# Patient Record
Sex: Male | Born: 1965 | Race: White | Hispanic: No | Marital: Married | Smoking: Former smoker
Health system: Southern US, Community
[De-identification: ages and names within clinical notes are randomized; demographics above are authoritative.]

## PROBLEM LIST (undated history)

## (undated) DIAGNOSIS — E785 Hyperlipidemia, unspecified: Secondary | ICD-10-CM

## (undated) DIAGNOSIS — L719 Rosacea, unspecified: Secondary | ICD-10-CM

## (undated) HISTORY — PX: TONSILLECTOMY: SUR1361

## (undated) HISTORY — PX: TYMPANOSTOMY TUBE PLACEMENT: SHX32

## (undated) HISTORY — PX: VASECTOMY: SHX75

## (undated) HISTORY — DX: Hyperlipidemia, unspecified: E78.5

## (undated) HISTORY — DX: Rosacea, unspecified: L71.9

---

## 1998-09-28 ENCOUNTER — Ambulatory Visit (HOSPITAL_COMMUNITY): Admission: RE | Admit: 1998-09-28 | Discharge: 1998-09-28 | Payer: Self-pay | Admitting: Family Medicine

## 2018-01-09 DIAGNOSIS — L719 Rosacea, unspecified: Secondary | ICD-10-CM | POA: Diagnosis not present

## 2018-01-09 DIAGNOSIS — Z0000 Encounter for general adult medical examination without abnormal findings: Secondary | ICD-10-CM | POA: Diagnosis not present

## 2018-01-09 DIAGNOSIS — E782 Mixed hyperlipidemia: Secondary | ICD-10-CM | POA: Diagnosis not present

## 2018-02-02 DIAGNOSIS — L918 Other hypertrophic disorders of the skin: Secondary | ICD-10-CM | POA: Diagnosis not present

## 2018-04-13 DIAGNOSIS — H2513 Age-related nuclear cataract, bilateral: Secondary | ICD-10-CM | POA: Diagnosis not present

## 2018-04-13 DIAGNOSIS — H40013 Open angle with borderline findings, low risk, bilateral: Secondary | ICD-10-CM | POA: Diagnosis not present

## 2018-07-16 DIAGNOSIS — M79604 Pain in right leg: Secondary | ICD-10-CM | POA: Diagnosis not present

## 2018-07-25 ENCOUNTER — Other Ambulatory Visit: Payer: Self-pay

## 2018-07-25 DIAGNOSIS — I83891 Varicose veins of right lower extremities with other complications: Secondary | ICD-10-CM

## 2018-08-09 ENCOUNTER — Encounter: Payer: Self-pay | Admitting: Surgery

## 2018-08-09 ENCOUNTER — Ambulatory Visit: Payer: BLUE CROSS/BLUE SHIELD | Admitting: Surgery

## 2018-08-09 ENCOUNTER — Other Ambulatory Visit: Payer: Self-pay

## 2018-08-09 ENCOUNTER — Ambulatory Visit (HOSPITAL_COMMUNITY)
Admission: RE | Admit: 2018-08-09 | Discharge: 2018-08-09 | Disposition: A | Discharge status: Home / Self Care | Payer: BLUE CROSS/BLUE SHIELD | Source: Ambulatory Visit | Attending: Surgery | Admitting: Surgery

## 2018-08-09 VITALS — BP 115/74 | HR 55 | Temp 98.2°F | Resp 20 | Ht 70.0 in | Wt 205.0 lb

## 2018-08-09 DIAGNOSIS — M25561 Pain in right knee: Secondary | ICD-10-CM | POA: Diagnosis not present

## 2018-08-09 DIAGNOSIS — I83891 Varicose veins of right lower extremities with other complications: Secondary | ICD-10-CM | POA: Insufficient documentation

## 2018-08-09 NOTE — Progress Notes (Signed)
[facility] [place]  Patient name: Francisco VASSELL MRN: [id number] DOB: [birth year] Sex: male   REQUESTING PROVIDER:    Aliene Beams   REASON FOR CONSULT:    Right leg pain  HISTORY OF PRESENT ILLNESS:   Francisco Vincent is a 52 y.o. male, who is referred today for evaluation of right leg pain.  The patient states that he noticed this beginning back in November.  It is painful to the touch.  It is a very sharp pain.  It he leaves it alone it does not bother him.  This is not associated with any swelling.  He denies any history of trauma.  Patient is a former smoker.  He suffers hypercholesterolemia which is managed with a statin.  PAST MEDICAL HISTORY    Past Medical History:  Diagnosis Date  . Hyperlipidemia   . Rosacea      FAMILY HISTORY   History reviewed. No pertinent family history.  SOCIAL HISTORY:   Social History   Socioeconomic History  . Marital status: Single    Spouse name: Not on file  . Number of children: Not on file  . Years of education: Not on file  . Highest education level: Not on file  Occupational History  . Not on file  Social Needs  . Financial resource strain: Not on file  . Food insecurity:    Worry: Not on file    Inability: Not on file  . Transportation needs:    Medical: Not on file    Non-medical: Not on file  Tobacco Use  . Smoking status: Former Games developer  . Smokeless tobacco: Never Used  Substance and Sexual Activity  . Alcohol use: Yes  . Drug use: Never  . Sexual activity: Not on file  Lifestyle  . Physical activity:    Days per week: Not on file    Minutes per session: Not on file  . Stress: Not on file  Relationships  . Social connections:    Talks on phone: Not on file    Gets together: Not on file    Attends religious service: Not on file    Active member of club or organization: Not on file    Attends meetings of clubs or organizations: Not on file   Relationship status: Not on file  . Intimate partner violence:    Fear of current or ex partner: Not on file    Emotionally abused: Not on file    Physically abused: Not on file    Forced sexual activity: Not on file  Other Topics Concern  . Not on file  Social History Narrative  . Not on file    ALLERGIES:    Allergies  Allergen Reactions  . Penicillins Anaphylaxis    CURRENT MEDICATIONS:    Current Outpatient Medications  Medication Sig Dispense Refill  . Calcium Polycarbophil (FIBER-CAPS PO) Take by mouth.    . fluticasone (FLONASE) 50 MCG/ACT nasal spray Place 2 sprays into both nostrils daily.    . Loratadine (CLARITIN) 10 MG CAPS Take by mouth.    . Multiple Vitamin (MULTIVITAMIN) tablet Take 1 tablet by mouth daily.    . Omega-3 Fatty Acids (FISH OIL BURP-LESS PO) Take by mouth.    . pravastatin (PRAVACHOL) 40 MG tablet Take 40 mg by mouth daily.     No current facility-administered medications for this visit.     REVIEW OF SYSTEMS:   [X]  denotes positive finding, [ ]  denotes negative  finding Cardiac  Comments:  Chest pain or chest pressure:    Shortness of breath upon exertion:    Short of breath when lying flat:    Irregular heart rhythm:        Vascular    Pain in calf, thigh, or hip brought on by ambulation:    Pain in feet at night that wakes you up from your sleep:     Blood clot in your veins:    Leg swelling:         Pulmonary    Oxygen at home:    Productive cough:     Wheezing:         Neurologic    Sudden weakness in arms or legs:     Sudden numbness in arms or legs:     Sudden onset of difficulty speaking or slurred speech:    Temporary loss of vision in one eye:     Problems with dizziness:         Gastrointestinal    Blood in stool:      Vomited blood:         Genitourinary    Burning when urinating:     Blood in urine:        Psychiatric    Major depression:         Hematologic    Bleeding problems:    Problems with  blood clotting too easily:        Skin    Rashes or ulcers:        Constitutional    Fever or chills:     PHYSICAL EXAM:   Vitals:   08/09/18 1523  BP: 115/74  Pulse: (!) 55  Resp: 20  Temp: 98.2 F (36.8 C)  SpO2: 97%  Weight: 205 lb (93 kg)  Height: 5\' 10"  (1.778 m)    GENERAL: The patient is a well-nourished male, in no acute distress. The vital signs are documented above. CARDIAC: There is a regular rate and rhythm.  VASCULAR: Multiphasic Doppler signals in bilateral dorsalis pedis, posterior tibial artery.  No significant edema bilateral lower extremity PULMONARY: Nonlabored respirations ABDOMEN: Soft and non-tender with normal pitched bowel sounds.  MUSCULOSKELETAL: There are no major deformities or cyanosis. NEUROLOGIC: No focal weakness or paresthesias are detected. SKIN: There are no ulcers or rashes noted. PSYCHIATRIC: The patient has a normal affect.  STUDIES:   I have ordered and reviewed the vascular lab studies with the following findings:  Venous Reflux Times Normal value < 0.5 sec +------------------------------+----------+---------+                               Right (ms)Left (ms) +------------------------------+----------+---------+ GSV at Saphenofemoral junction3154.00             +------------------------------+----------+---------+ GSV prox thigh                5325.00             +------------------------------+----------+---------+ GSV mid thigh                 3638.00             +------------------------------+----------+---------+ GSV dist thigh                3051.00             +------------------------------+----------+---------+ GSV at knee  1929.00             +------------------------------+----------+---------+ GSV prox calf                 1518.00             +------------------------------+----------+---------+  Vein  Diameters: +------------------------------+----------+---------+                               Right (cm)Left (cm) +------------------------------+----------+---------+ GSV at Saphenofemoral junction0.758               +------------------------------+----------+---------+ GSV at prox thigh             0.601               +------------------------------+----------+---------+ GSV at mid thigh              0.385               +------------------------------+----------+---------+ GSV at distal thigh           0.275               +------------------------------+----------+---------+ GSV at knee                   0.321               +------------------------------+----------+---------+ GSV prox calf                 0.364               +------------------------------+----------+---------+ GSV mid calf                  0.337               +------------------------------+----------+---------+ SSV origin                    0.186               +------------------------------+----------+---------+ SSV prox                      0.183               +------------------------------+----------+---------+ SSV mid                       0.132               +------------------------------+----------+---------+    Right Reflux Technical Findings: Distal thigh GSV travels out of fascia.   Summary: Right: Abnormal reflux times were noted in the great saphenous vein at the saphenofemoral junction, great saphenous vein at the proximal thigh, great saphenous vein at the mid thigh, great saphenous vein at the distal thigh, great saphenous vein at the  knee, and great saphenous vein at the prox calf. There is no evidence of deep vein thrombosis in the lower extremity in the CFV, FV, and POPV. There is no evidence of superficial venous thrombosis in the GSV and SSV. ASSESSMENT and PLAN   Right leg pain: I discussed with the patient that I do not feel that his symptoms  are related to a vascular etiology.  He has a normal arterial exam.  The venous reflux test was slightly abnormal however I do not feel that this would explain his current symptoms.  This to me sounds like a peripheral nerve issue.  He states that it has improved over the past several weeks.  I suspect that this will resolve with time.   Durene CalWells Brabham, MD [facility] Tel [phone] Pager [phone]

## 2018-10-22 DIAGNOSIS — J069 Acute upper respiratory infection, unspecified: Secondary | ICD-10-CM | POA: Diagnosis not present

## 2019-01-11 DIAGNOSIS — Z0000 Encounter for general adult medical examination without abnormal findings: Secondary | ICD-10-CM | POA: Diagnosis not present

## 2019-01-11 DIAGNOSIS — E782 Mixed hyperlipidemia: Secondary | ICD-10-CM | POA: Diagnosis not present

## 2019-01-11 DIAGNOSIS — L719 Rosacea, unspecified: Secondary | ICD-10-CM | POA: Diagnosis not present

## 2019-01-11 DIAGNOSIS — Z8249 Family history of ischemic heart disease and other diseases of the circulatory system: Secondary | ICD-10-CM | POA: Diagnosis not present

## 2019-02-01 DIAGNOSIS — Z125 Encounter for screening for malignant neoplasm of prostate: Secondary | ICD-10-CM | POA: Diagnosis not present

## 2019-02-01 DIAGNOSIS — E782 Mixed hyperlipidemia: Secondary | ICD-10-CM | POA: Diagnosis not present

## 2019-02-07 ENCOUNTER — Telehealth: Payer: Self-pay | Admitting: *Deleted

## 2019-02-07 NOTE — Telephone Encounter (Signed)
Spoke to patient - virtual appt change to in office appt. Verbalized understanding       Primary Cardiologist: No primary care provider on file.   Pt contacted.  History and symptoms reviewed.  Pt will f/u with [facility] provider as scheduled.  Pt. advised that we are restricting visitors at this time and request that only patients present for check-in prior to their appointment.  All other visitors should remain in their car.  If necessary, only one visitor may come with the patient, into the building. For everyone's safety, all patients and visitor entering our practice area should expect to be screened again prior to entering our waiting area. Aware to wear face covering .  Raiford Simmonds, RN  02/07/2019 11:43 AM

## 2019-02-08 ENCOUNTER — Telehealth: Payer: Self-pay | Admitting: Cardiology

## 2019-02-08 NOTE — Telephone Encounter (Signed)

## 2019-02-11 ENCOUNTER — Encounter: Payer: Self-pay | Admitting: Cardiology

## 2019-02-11 ENCOUNTER — Other Ambulatory Visit: Payer: Self-pay

## 2019-02-11 ENCOUNTER — Ambulatory Visit: Payer: BC Managed Care – PPO | Admitting: Cardiology

## 2019-02-11 VITALS — BP 133/82 | HR 62 | Ht 70.0 in | Wt 203.2 lb

## 2019-02-11 DIAGNOSIS — R0789 Other chest pain: Secondary | ICD-10-CM

## 2019-02-11 DIAGNOSIS — Z8249 Family history of ischemic heart disease and other diseases of the circulatory system: Secondary | ICD-10-CM

## 2019-02-11 NOTE — Progress Notes (Signed)
PCP: Elias Elseeade, Robert, MD  Clinic Note: Chief Complaint  Patient presents with  . New Patient (Initial Visit)  . Chest Pain    No family history of CAD    HPI: Francisco Vincent is a 53 y.o. male who is being seen today for the evaluation of chest pain with family history of CAD.  He has been seen as a self referral after discussion with his PCP, Elias Elseeade, Robert, MD.  Francisco Vincent was last seen recently by 1 of the PAs working with his PCP and he noted having some episodes of discomfort between his shoulder blades associated with exertion and with rest.  As such he was referred for cardiology evaluation based on the fact he has a pretty significant family history of CAD in his father and grandparents. (FH updated)   Recent Hospitalizations: none  Studies Personally Reviewed - (if available, images/films reviewed: From Epic Chart or Care Everywhere)  n/a  Interval History: [name] states that he is usually doing fairly well.  He has been trying to increase his level of exercise doing a lot of walking over the summer during the COVID-19 restrictions as part of a work-related incentive package.  He notes that about a month ago they had really picked up their walking in the intensity of walking more than usual and when he was doing that he started noticing a lot of pain between his shoulder blades not really any dyspnea or any shoulder or chest pain.  This concerned him however because his father when he first had bypass surgery of 54 really did not have symptoms at all.  He was found to multivessel disease and underwent CABG.  Now he also indicates that over the last couple months since the COVID-19 restrictions he is been working in home and does not still have a very comfortable dynamic chair and thinks some of it may be related to that as well.  He is just a little bit concerned about his family history. Otherwise completely asymptomatic from a cardiac standpoint:  No chest pain at rest for  shortness of breath with rest or exertion.  No PND, orthopnea or edema. No palpitations, lightheadedness, dizziness, weakness or syncope/near syncope. No TIA/amaurosis fugax symptoms. No melena, hematochezia, hematuria, or epstaxis. No claudication.  ROS: A comprehensive was performed. Review of Systems  Constitutional: Negative for malaise/fatigue.  Gastrointestinal: Negative for abdominal pain, constipation and vomiting.  Musculoskeletal: Positive for back pain (As noted in HPI). Negative for falls and joint pain.  Neurological: Negative for dizziness, focal weakness and headaches.  All other systems reviewed and are negative.   The patient does not have symptoms concerning for COVID-19 infection (fever, chills, cough, or new shortness of breath).  The patient is practicing social distancing.  Working @ home since March.   COVID-19 Education: The signs and symptoms of COVID-19 were discussed with the patient and how to seek care for testing (follow up with PCP or arrange E-visit).   The importance of social distancing was discussed today.   I have reviewed and (if needed) personally updated the patient's problem list, medications, allergies, past medical and surgical history, social and family history.   Past Medical History:  Diagnosis Date  . Hyperlipidemia    Has been on pravastatin for over 20 years after his father underwent CABG  . Rosacea     Past Surgical History:  Procedure Laterality Date  . TONSILLECTOMY    . TYMPANOSTOMY TUBE PLACEMENT    . VASECTOMY  Current Meds  Medication Sig  . Calcium Polycarbophil (FIBER-CAPS PO) Take by mouth.  . fluticasone (FLONASE) 50 MCG/ACT nasal spray Place 2 sprays into both nostrils daily.  . Loratadine (CLARITIN) 10 MG CAPS Take by mouth.  . Multiple Vitamin (MULTIVITAMIN) tablet Take 1 tablet by mouth daily.  . Omega-3 Fatty Acids (FISH OIL BURP-LESS PO) Take by mouth.  . pravastatin (PRAVACHOL) 40 MG tablet Take 40  mg by mouth daily.    Allergies  Allergen Reactions  . Penicillins Anaphylaxis    Social History   Tobacco Use  . Smoking status: Former Games developermoker  . Smokeless tobacco: Never Used  Substance Use Topics  . Alcohol use: Yes  . Drug use: Never   Social History   Social History Narrative   Is a very active gentleman tries to get routine exercise.  Smoked in college, but not since.    family history includes CAD in his maternal grandfather, maternal grandmother, and paternal grandfather; CAD (age of onset: 1755) in his father; Dementia in his mother; Narcolepsy in his mother.  Wt Readings from Last 3 Encounters:  02/11/19 203 lb 3.2 oz (92.2 kg)  08/09/18 205 lb (93 kg)    PHYSICAL EXAM BP 133/82   Pulse 62   Ht 5\' 10"  (1.778 m)   Wt 203 lb 3.2 oz (92.2 kg)   BMI 29.16 kg/m  Physical Exam  Constitutional: He is oriented to person, place, and time. He appears well-developed and well-nourished. No distress.  Well-groomed.  Healthy-appearing  HENT:  Head: Normocephalic and atraumatic.  Mouth/Throat: Oropharynx is clear and moist.  Eyes: Pupils are equal, round, and reactive to light. Conjunctivae and EOM are normal. No scleral icterus.  Neck: Normal range of motion. Neck supple. No hepatojugular reflux and no JVD present. Carotid bruit is not present.  Cardiovascular: Normal rate, regular rhythm, normal heart sounds, intact distal pulses and normal pulses.  No extrasystoles are present. PMI is not displaced. Exam reveals no gallop and no friction rub.  No murmur heard. Pulmonary/Chest: Effort normal and breath sounds normal. No respiratory distress. He has no wheezes. He has no rales.  Abdominal: Soft. Bowel sounds are normal. He exhibits no distension. There is no abdominal tenderness. There is no rebound.  Musculoskeletal: Normal range of motion.        General: No edema.  Neurological: He is alert and oriented to person, place, and time. No cranial nerve deficit.  Skin: Skin  is warm and dry.  Psychiatric: He has a normal mood and affect. His behavior is normal. Judgment and thought content normal.  Vitals reviewed.    Adult ECG Report  Rate: 62 ;  Rhythm: normal sinus rhythm and Normal axis, intervals and durations.;   Narrative Interpretation: Normal EKG   Other studies Reviewed: Additional studies/ records that were reviewed today include:  Recent Labs:   No labs available    ASSESSMENT / PLAN:  [name] definitely has a pretty significant family history of CAD in his grandparents as well as his father.  He is relatively anxious father was when he was diagnosed with CAD.  Although symptoms do sound relatively atypical for an anginal equivalent, it is little concerning.  We talked about different options to evaluate.  I think since he was having symptoms that he has been having before, I would like to put him a treadmill stress test in order to see if he can reproduce symptoms.  If there is any abnormality then we would probably proceed  with a more invasive imaging study.  If this study is relatively normal, then we talked about the possibility of a coronary calcium score for baseline cardiovascular risk in the absence of symptoms or abnormal stress test.  He will continue atorvastatin and we talked about the possibility of starting baby aspirin pending results.  Problem List Items Addressed This Visit    Family history of premature CAD   Relevant Orders   EKG 12-Lead (Completed)   EXERCISE TOLERANCE TEST (ETT)   Atypical chest pain - Primary   Relevant Orders   EKG 12-Lead (Completed)   EXERCISE TOLERANCE TEST (ETT)      I spent a total of 25 minutes with the patient and chart review. >  50% of the time was spent in direct patient consultation.  Additional 10 minutes spent with charting.  Current medicines are reviewed at length with the patient today.  (+/- concerns) none The following changes have been made:  None none  Patient Instructions   Medication Instructions:   NO CHANGES   Lab work:  NIT NEEDED  Testing/Procedures:  WILL BE SCHEDULE AT [place] [address] PRIOR TO TEST A COVID TEST WILL NEED TO BE COMPLETED AT LEAST 3 DAYS PRIOR-  [place] TES IS COMPLETED-- Your physician has requested that you have an exercise tolerance test. For further information please visit [email or web address]. Please also follow instruction sheet, as given.    Follow-Up: At [facility], you and your health needs are our priority.  As part of our continuing mission to provide you with exceptional heart care, we have created designated Provider Care Teams.  These Care Teams include your primary Cardiologist (physician) and Advanced Practice Providers (APPs -  Physician Assistants and Nurse Practitioners) who all work together to provide you with the care you need, when you need it. . You will need a follow up appointment in 1  months.  Please call our office 2 months in advance to schedule this appointment.  You may see Glenetta Hew, MD or one of the following Advanced Practice Providers on your designated Care Team:   . Rosaria Ferries, PA-C . Jory Sims, DNP, ANP  Any Other Special Instructions Will Be Listed Below (If Applicable).    Studies Ordered:   Orders Placed This Encounter  Procedures  . EXERCISE TOLERANCE TEST (ETT)  . EKG 12-Lead      Glenetta Hew, M.D., M.S. Interventional Cardiologist   Pager # [phone] Phone # [phone] [address] [place], [place] [address]   Thank you for choosing Heartcare at [facility]!!

## 2019-02-11 NOTE — Patient Instructions (Addendum)
Medication Instructions:   NO CHANGES   Lab work:  NIT NEEDED  Testing/Procedures:  WILL BE SCHEDULE AT [place] [address] PRIOR TO TEST A COVID TEST WILL NEED TO BE COMPLETED AT LEAST 3 DAYS PRIOR-  [place] TES IS COMPLETED-- Your physician has requested that you have an exercise tolerance test. For further information please visit [email or web address]. Please also follow instruction sheet, as given.    Follow-Up: At [facility], you and your health needs are our priority.  As part of our continuing mission to provide you with exceptional heart care, we have created designated Provider Care Teams.  These Care Teams include your primary Cardiologist (physician) and Advanced Practice Providers (APPs -  Physician Assistants and Nurse Practitioners) who all work together to provide you with the care you need, when you need it. . You will need a follow up appointment in 1  months.  Please call our office 2 months in advance to schedule this appointment.  You may see Glenetta Hew, MD or one of the following Advanced Practice Providers on your designated Care Team:   . Rosaria Ferries, PA-C . Jory Sims, DNP, ANP  Any Other Special Instructions Will Be Listed Below (If Applicable).

## 2019-02-12 ENCOUNTER — Encounter: Payer: Self-pay | Admitting: Cardiology

## 2019-02-27 ENCOUNTER — Telehealth: Payer: Self-pay | Admitting: Cardiology

## 2019-02-27 NOTE — Telephone Encounter (Signed)
Patient is calling to see when he will need his covid test before his test next week.  Please call with info.

## 2019-02-27 NOTE — Telephone Encounter (Signed)
  There is a GXT scheduled for Francisco Vincent, a patient of Dr. Ellyn Hack, on Tuesday 03/05/19. Please call the patient and schedule/order Covid testing by Friday 03/01/19 at [facility] and instruct the patient to self-isolate after the Covid testing is performed until the GXT appointment. If the patient does not have the Covid testing performed and if we don't have the testing results back prior to the GXT appointment, we will have to cancel the test.  [name]      Left detail message on patient secure voicemail   scheduled for covid test July 17,2020 at 2:55 pm  [name] [address] [facility]- [address] Self isolate until test is completed on Tuesday July 21 ,2020 appointment also will be in mychart Any question may call back

## 2019-02-28 ENCOUNTER — Telehealth (HOSPITAL_COMMUNITY): Payer: Self-pay

## 2019-02-28 NOTE — Telephone Encounter (Signed)
Encounter complete. 

## 2019-03-01 ENCOUNTER — Other Ambulatory Visit (HOSPITAL_COMMUNITY)
Admission: RE | Admit: 2019-03-01 | Discharge: 2019-03-01 | Disposition: A | Discharge status: Home / Self Care | Payer: BC Managed Care – PPO | Source: Ambulatory Visit | Attending: Cardiology | Admitting: Cardiology

## 2019-03-01 DIAGNOSIS — Z1159 Encounter for screening for other viral diseases: Secondary | ICD-10-CM | POA: Insufficient documentation

## 2019-03-01 LAB — SARS CORONAVIRUS 2 (TAT 6-24 HRS): SARS Coronavirus 2: NEGATIVE

## 2019-03-05 ENCOUNTER — Ambulatory Visit (HOSPITAL_COMMUNITY)
Admission: RE | Admit: 2019-03-05 | Discharge: 2019-03-05 | Disposition: A | Discharge status: Home / Self Care | Payer: BC Managed Care – PPO | Source: Ambulatory Visit | Attending: Cardiology | Admitting: Cardiology

## 2019-03-05 ENCOUNTER — Other Ambulatory Visit: Payer: Self-pay

## 2019-03-05 DIAGNOSIS — Z8249 Family history of ischemic heart disease and other diseases of the circulatory system: Secondary | ICD-10-CM

## 2019-03-05 DIAGNOSIS — R0789 Other chest pain: Secondary | ICD-10-CM

## 2019-03-05 LAB — EXERCISE TOLERANCE TEST
Estimated workload: 12 METS
Exercise duration (min): 10 min
Exercise duration (sec): 12 s
MPHR: 170 {beats}/min
Peak HR: 162 {beats}/min
Percent HR: 98 %
Rest HR: 68 {beats}/min

## 2019-03-11 ENCOUNTER — Ambulatory Visit: Payer: BC Managed Care – PPO | Admitting: Cardiology

## 2019-03-11 ENCOUNTER — Other Ambulatory Visit: Payer: Self-pay

## 2019-03-11 ENCOUNTER — Encounter: Payer: Self-pay | Admitting: Cardiology

## 2019-03-11 ENCOUNTER — Telehealth: Payer: Self-pay | Admitting: Cardiology

## 2019-03-11 VITALS — BP 118/82 | HR 58 | Temp 97.5°F | Ht 70.0 in | Wt 204.2 lb

## 2019-03-11 DIAGNOSIS — Z8249 Family history of ischemic heart disease and other diseases of the circulatory system: Secondary | ICD-10-CM

## 2019-03-11 DIAGNOSIS — R0789 Other chest pain: Secondary | ICD-10-CM | POA: Diagnosis not present

## 2019-03-11 NOTE — Patient Instructions (Addendum)
Medication Instructions:   If you need a refill on your cardiac medications before your next appointment, please call your pharmacy.   Lab work:  If you have labs (blood work) drawn today and your tests are completely normal, you will receive your results only by: Marland Kitchen. MyChart Message (if you have MyChart) OR . A paper copy in the mail If you have any lab test that is abnormal or we need to change your treatment, we will call you to review the results.  Testing/Procedures: WILL BE SCHEDULE IN JAN 2021 CT coronary calcium score. This test is done at [address]. This is $150 out of pocket.   Coronary CalciumScan A coronary calcium scan is an imaging test used to look for deposits of calcium and other fatty materials (plaques) in the inner lining of the blood vessels of the heart (coronary arteries). These deposits of calcium and plaques can partly clog and narrow the coronary arteries without producing any symptoms or warning signs. This puts a person at risk for a heart attack. This test can detect these deposits before symptoms develop. Tell a health care provider about:  Any allergies you have.  All medicines you are taking, including vitamins, herbs, eye drops, creams, and over-the-counter medicines.  Any problems you or family members have had with anesthetic medicines.  Any blood disorders you have.  Any surgeries you have had.  Any medical conditions you have.  Whether you are pregnant or may be pregnant. What are the risks? Generally, this is a safe procedure. However, problems may occur, including:  Harm to a pregnant woman and her unborn baby. This test involves the use of radiation. Radiation exposure can be dangerous to a pregnant woman and her unborn baby. If you are pregnant, you generally should not have this procedure done.  Slight increase in the risk of cancer. This is because of the radiation involved in the test. What happens before the  procedure? No preparation is needed for this procedure. What happens during the procedure?  You will undress and remove any jewelry around your neck or chest.  You will put on a hospital gown.  Sticky electrodes will be placed on your chest. The electrodes will be connected to an electrocardiogram (ECG) machine to record a tracing of the electrical activity of your heart.  A CT scanner will take pictures of your heart. During this time, you will be asked to lie still and hold your breath for 2-3 seconds while a picture of your heart is being taken. The procedure may vary among health care providers and hospitals. What happens after the procedure?  You can get dressed.  You can return to your normal activities.  It is up to you to get the results of your test. Ask your health care provider, or the department that is doing the test, when your results will be ready. Summary  A coronary calcium scan is an imaging test used to look for deposits of calcium and other fatty materials (plaques) in the inner lining of the blood vessels of the heart (coronary arteries).  Generally, this is a safe procedure. Tell your health care provider if you are pregnant or may be pregnant.  No preparation is needed for this procedure.  A CT scanner will take pictures of your heart.  You can return to your normal activities after the scan is done. This information is not intended to replace advice given to you by your health care provider. Make  sure you discuss any questions you have with your health care provider. Document Released: 01/28/2008 Document Revised: 06/20/2016 Document Reviewed: 06/20/2016 Elsevier Interactive Patient Education  2017 [place]: At [facility], you and your health needs are our priority.  As part of our continuing mission to provide you with exceptional heart care, we have created designated Provider Care Teams.  These Care Teams include your primary  Cardiologist (physician) and Advanced Practice Providers (APPs -  Physician Assistants and Nurse Practitioners) who all work together to provide you with the care you need, when you need it. . You will need a follow up appointment in   7  Months JAN /FEB 2021.  Please call our office 2 months in advance to schedule this appointment.  You may see Glenetta Hew, MD or one of the following Advanced Practice Providers on your designated Care Team:   . Rosaria Ferries, PA-C . Jory Sims, DNP, ANP  Any Other Special Instructions Will Be Listed Below (If Applicable).

## 2019-03-11 NOTE — Telephone Encounter (Signed)

## 2019-03-11 NOTE — Progress Notes (Signed)
PCP: Elias Elseeade, Robert, MD  Clinic Note: Chief Complaint  Patient presents with  . Follow-up    1 month f/u no complaints today. Meds reviewed verbally with pt.    HPI: Francisco Vincent is a 53 y.o. male who is being seen today for the evaluation of chest pain with family history of CAD.  He has been seen as a self referral after discussion with his PCP, Elias Elseeade, Robert, MD.  Francisco Vincent was seen in initial consultation on June 29 at the request of 1 of his PCPs PAs.  This was in response to several episodes of discomfort between his shoulder blades both with exertion and at rest.  Referral for cardiology evaluation based on significant family history of CAD. --> He indicated to me that he actually was doing fairly well but was try to increase his level of activity he was doing over the summer with increasing his level of walking.  Several months ago when picking up the intensity of his echo she started noticing pain between his shoulder blades while exerting himself, there was no associated dyspnea or true chest pain. -->  Since that episode he notes that he purchased a more comfortable chair to do his work from home during the COVID-19 restrictions.  After doing so, this discomfort in his back seem to have improved. --> He was evaluated with a GXT  Recent Hospitalizations: none  Studies Personally Reviewed - (if available, images/films reviewed: From Epic Chart or Care Everywhere)  GXT 03/06/2019: Exercised for 10:12 min.  Stopped because of difficulty breathing (related to mask).  Achieved 85% max.  Heart rate at 9.2 min.  1 mm upsloping ST segment depression noted in inferior leads.  (Nondiagnostic upsloping ST segment changes).  Negative stress test  Interval History: Francisco Vincent returns today actually doing fine.  He has not had any more of the back discomfort and has tried to pick up his level of exercise.  He was pleased to hear the results of his stress test, and felt like he probably could walk  further on the test is mask required to wear because of COVID-19.  Cardiovascular ROS: no chest pain or dyspnea on exertion negative for - edema, irregular heartbeat, orthopnea, palpitations, paroxysmal nocturnal dyspnea, rapid heart rate, shortness of breath or Syncope/near syncope, TIA/amaurosis fugax, claudication  ROS: A comprehensive was performed. Review of Systems  Constitutional: Negative for malaise/fatigue.  HENT: Negative for nosebleeds.   Respiratory: Negative for cough and shortness of breath.   Gastrointestinal: Negative for abdominal pain, constipation and vomiting.  Genitourinary: Negative for hematuria.  Musculoskeletal: Negative for back pain (No further back pain), falls and joint pain.  Neurological: Negative for dizziness, focal weakness and headaches.  Psychiatric/Behavioral: Negative.     The patient does not have symptoms concerning for COVID-19 infection (fever, chills, cough, or new shortness of breath).  The patient is practicing social distancing.  He is still working from home as he has been since March   COVID-19 Education: The signs and symptoms of COVID-19 were discussed with the patient and how to seek care for testing (follow up with PCP or arrange E-visit).   The importance of social distancing was discussed today.  I have reviewed and (if needed) personally updated the patient's problem list, medications, allergies, past medical and surgical history, social and family history.   Past Medical History:  Diagnosis Date  . Hyperlipidemia    Has been on pravastatin for over 20 years after his father underwent  CABG  . Rosacea     Past Surgical History:  Procedure Laterality Date  . TONSILLECTOMY    . TYMPANOSTOMY TUBE PLACEMENT    . VASECTOMY      Current Meds  Medication Sig  . Calcium Polycarbophil (FIBER-CAPS PO) Take by mouth.  . fluticasone (FLONASE) 50 MCG/ACT nasal spray Place 2 sprays into both nostrils as needed.   . Loratadine  (CLARITIN) 10 MG CAPS Take by mouth.  . Multiple Vitamin (MULTIVITAMIN) tablet Take 1 tablet by mouth daily.  . Omega-3 Fatty Acids (FISH OIL BURP-LESS PO) Take by mouth.  . pravastatin (PRAVACHOL) 40 MG tablet Take 40 mg by mouth daily.    Allergies  Allergen Reactions  . Penicillins Anaphylaxis    Social History   Tobacco Use  . Smoking status: Former Research scientist (life sciences)  . Smokeless tobacco: Never Used  Substance Use Topics  . Alcohol use: Yes  . Drug use: Never   Social History   Social History Narrative   Is a very active gentleman tries to get routine exercise.  Smoked in college, but not since.    family history includes CAD in his maternal grandfather, maternal grandmother, and paternal grandfather; CAD (age of onset: 50) in his father; Dementia in his mother; Narcolepsy in his mother.  Wt Readings from Last 3 Encounters:  03/11/19 204 lb 4 oz (92.6 kg)  02/11/19 203 lb 3.2 oz (92.2 kg)  08/09/18 205 lb (93 kg)    PHYSICAL EXAM BP 118/82 (BP Location: Left Arm, Patient Position: Sitting, Cuff Size: Normal)   Pulse (!) 58   Temp (!) 97.5 F (36.4 C)   Ht 5\' 10"  (1.778 m)   Wt 204 lb 4 oz (92.6 kg)   SpO2 98%   BMI 29.31 kg/m  Physical Exam  Constitutional: He is oriented to person, place, and time. He appears well-developed and well-nourished. No distress.  Well-groomed.  Healthy-appearing  HENT:  Head: Normocephalic and atraumatic.  Mouth/Throat: Oropharynx is clear and moist.  Eyes: No scleral icterus.  Neck: Normal range of motion. Neck supple. No hepatojugular reflux and no JVD present. Carotid bruit is not present.  Cardiovascular: Normal rate, regular rhythm, normal heart sounds, intact distal pulses and normal pulses.  No extrasystoles are present. PMI is not displaced. Exam reveals no gallop and no friction rub.  No murmur heard. Pulmonary/Chest: Effort normal and breath sounds normal. No respiratory distress. He has no wheezes. He has no rales.  Abdominal:  Soft. Bowel sounds are normal. He exhibits no distension. There is no abdominal tenderness.  Musculoskeletal: Normal range of motion.        General: No edema.  Neurological: He is alert and oriented to person, place, and time.  Psychiatric: He has a normal mood and affect. His behavior is normal. Judgment and thought content normal.  Vitals reviewed.    Adult ECG Report  Rate: 58;  Rhythm: sinus bradycardia and Otherwise normal axis, intervals and durations.;   Narrative Interpretation: Normal EKG   Other studies Reviewed: Additional studies/ records that were reviewed today include:  Recent Labs:   No labs available   ASSESSMENT / PLAN:  Francisco Vincent is here to discuss results of his GXT.  Did very well.  He is still concerned however about his family history of CAD and would like to investigate his cardiovascular risk in more detail.  The GXT answer the questions as to his concerning exertional back pain, but does not answer the question about the knee  underlying risk of CAD.  He is on a statin, and has well-controlled blood pressure.  With a negative GXT, further ischemic evaluation is warranted, however I think a coronary calcium score would be a reasonable option help determine baseline cardiovascular risk and the potential need for more aggressive lipid management.  He remains on pravastatin, but unless there is significant calcification seen on coronary calcium score, I would not necessarily recommend aspirin at this time.  Problem List Items Addressed This Visit    Family history of premature CAD (Chronic)   Relevant Orders   EKG 12-Lead (Completed)   CT CARDIAC SCORING   Atypical chest pain - Primary   Relevant Orders   EKG 12-Lead (Completed)   CT CARDIAC SCORING      I spent a total of 15minutes with the patient and chart review. >  50% of the time was spent in direct patient consultation.  Current medicines are reviewed at length with the patient today.  (+/-  concerns) none The following changes have been made:  None   Patient Instructions  Medication Instructions:  None  If you need a refill on your cardiac medications before your next appointment, please call your pharmacy.   Lab work: n/a  Testing/Procedures: WILL BE SCHEDULE IN JAN 2021 CT coronary calcium score. This test is done at [address]. This is $150 out of pocket.  Follow-Up: . You will need a follow up appointment in   7  Months JAN /FEB 2021.  Please call our office 2 months in advance to schedule this appointment.  You may see Bryan Lemmaavid Jahking Lesser, MD or one of the following Advanced Practice Providers on your designated Care Team:   . Theodore DemarkRhonda Barrett, PA-C . Joni ReiningKathryn Lawrence, DNP, ANP  Any Other Special Instructions Will Be Listed Below (If Applicable).    Studies Ordered:   Orders Placed This Encounter  Procedures  . CT CARDIAC SCORING  . EKG 12-Lead      Bryan Lemmaavid Elyjah Hazan, M.D., M.S. Interventional Cardiologist   Pager # [phone] Phone # [phone] [address] [place], [place] [address]   Thank you for choosing [facility] at [facility]!!

## 2019-03-12 ENCOUNTER — Telehealth: Payer: Self-pay | Admitting: Cardiology

## 2019-03-12 NOTE — Telephone Encounter (Signed)
Left message regarding Calcium Scoring scheduled for September 11, 2019 at 8:30 am.  Location [address]  Patient instruced to call if any questions.  Will also mail information to patient.

## 2019-03-14 ENCOUNTER — Encounter: Payer: Self-pay | Admitting: Cardiology

## 2019-04-12 DIAGNOSIS — H40013 Open angle with borderline findings, low risk, bilateral: Secondary | ICD-10-CM | POA: Diagnosis not present

## 2019-04-12 DIAGNOSIS — H2513 Age-related nuclear cataract, bilateral: Secondary | ICD-10-CM | POA: Diagnosis not present

## 2019-06-05 DIAGNOSIS — Z23 Encounter for immunization: Secondary | ICD-10-CM | POA: Diagnosis not present

## 2019-07-08 DIAGNOSIS — Z03818 Encounter for observation for suspected exposure to other biological agents ruled out: Secondary | ICD-10-CM | POA: Diagnosis not present

## 2019-09-11 ENCOUNTER — Inpatient Hospital Stay: Admission: RE | Admit: 2019-09-11 | Payer: BC Managed Care – PPO | Source: Ambulatory Visit

## 2019-09-16 ENCOUNTER — Ambulatory Visit (INDEPENDENT_AMBULATORY_CARE_PROVIDER_SITE_OTHER)
Admission: RE | Admit: 2019-09-16 | Discharge: 2019-09-16 | Disposition: A | Discharge status: Home / Self Care | Payer: Self-pay | Source: Ambulatory Visit | Attending: Cardiology | Admitting: Cardiology

## 2019-09-16 ENCOUNTER — Other Ambulatory Visit: Payer: Self-pay

## 2019-09-16 DIAGNOSIS — R0789 Other chest pain: Secondary | ICD-10-CM

## 2019-09-16 DIAGNOSIS — Z8249 Family history of ischemic heart disease and other diseases of the circulatory system: Secondary | ICD-10-CM

## 2019-09-17 ENCOUNTER — Telehealth: Payer: Self-pay

## 2019-09-17 NOTE — Telephone Encounter (Signed)
-----   Message from Marykay Lex, MD sent at 09/16/2019 11:24 PM EST ----- Coronary artery calcium score shows a calcium score of 34 which is relatively low risk.  There is also calcium noted on the aorta.  This suggest that there is atherosclerosis on the heart arteries and aorta, but not likely to be significant.  Recommendation is to continue risk factor modification but no further testing needed.  Bryan Lemma, MD

## 2019-10-14 ENCOUNTER — Ambulatory Visit: Payer: 59 | Admitting: Cardiology

## 2019-10-14 ENCOUNTER — Other Ambulatory Visit: Payer: Self-pay

## 2019-10-14 ENCOUNTER — Encounter: Payer: Self-pay | Admitting: Cardiology

## 2019-10-14 VITALS — BP 118/80 | HR 63 | Ht 70.0 in | Wt 208.0 lb

## 2019-10-14 DIAGNOSIS — Z8249 Family history of ischemic heart disease and other diseases of the circulatory system: Secondary | ICD-10-CM

## 2019-10-14 DIAGNOSIS — R0789 Other chest pain: Secondary | ICD-10-CM | POA: Diagnosis not present

## 2019-10-14 NOTE — Progress Notes (Signed)
Primary Care Provider: Maury Dus, MD Cardiologist: No primary care provider on file. Electrophysiologist: None  Clinic Note: Chief Complaint  Patient presents with  . Follow-up    Test results     HPI:    Francisco Vincent is a 54 y.o. male who is being seen today for 6+ month follow-up to discuss results of coronary patient with significant family history.  He was initially seen at the request of Maury Dus, MD.  Francisco Vincent was last seen on March 11, 2019 for 1 month follow-up after GXT that was done in order to indicate for that he was safe doing a significant bout of walking with his family over the course of the summer etc.  We then talked about checking a coronary calcium score for baseline risk assessment given his family history of CAD.  Other than the study being ordered, no change in medications were made.  Recent Hospitalizations: None  Reviewed  CV studies:    The following studies were reviewed today: (if available, images/films reviewed: From Epic Chart or Care Everywhere) . Coronary Calcium Score: 34   Interval History:   Francisco Vincent returns today doing well.  He is trying to do this he can exercise, but is having a hard time losing weight.  He seems to be tolerating pravastatin.  He notes his blood pressure is complete well-controlled.  He is not having any active angina or heart failure symptoms.  No syncope or near syncope.  No TIA amaurosis fugax or palpitations. He is exercising relatively regularly and doing other walks.  Was very happy to hear the results of his coronary calcium score.  The patient does not have symptoms concerning for COVID-19 infection (fever, chills, cough, or new shortness of breath).  The patient is practicing social distancing & Masking.    REVIEWED OF SYSTEMS   Review of Systems  Constitutional: Negative for malaise/fatigue.  HENT: Negative for congestion.   Gastrointestinal: Negative for blood in stool and melena.    Genitourinary: Negative for hematuria.  Musculoskeletal: Negative for falls and joint pain.  Neurological: Negative for dizziness and headaches.  Psychiatric/Behavioral: Negative.     I have reviewed and (if needed) personally updated the patient's problem list, medications, allergies, past medical and surgical history, social and family history.   PAST MEDICAL HISTORY   Past Medical History:  Diagnosis Date  . Hyperlipidemia    Has been on pravastatin for over 20 years after his father underwent CABG  . Rosacea     PAST SURGICAL HISTORY   Past Surgical History:  Procedure Laterality Date  . TONSILLECTOMY    . TYMPANOSTOMY TUBE PLACEMENT    . VASECTOMY      GXT 03/06/2019: Exercised for 10:12 min.  Stopped because of difficulty breathing (related to mask).  Achieved 85% max.  Heart rate at 9.2 min.  1 mm upsloping ST segment depression noted in inferior leads.  (Nondiagnostic upsloping ST segment changes).  Negative stress test  MEDICATIONS/ALLERGIES   Current Meds  Medication Sig  . Calcium Polycarbophil (FIBER-CAPS PO) Take by mouth.  . fluticasone (FLONASE) 50 MCG/ACT nasal spray Place 2 sprays into both nostrils as needed.   . Loratadine (CLARITIN) 10 MG CAPS Take by mouth.  . Multiple Vitamin (MULTIVITAMIN) tablet Take 1 tablet by mouth daily.  . Omega-3 Fatty Acids (FISH OIL BURP-LESS PO) Take by mouth.  . pravastatin (PRAVACHOL) 40 MG tablet Take 40 mg by mouth daily.    Allergies  Allergen Reactions  . Penicillins Anaphylaxis    SOCIAL HISTORY/FAMILY HISTORY   Reviewed in Epic:  Pertinent findings: None  OBJCTIVE -PE, EKG, labs   Wt Readings from Last 3 Encounters:  10/14/19 208 lb (94.3 kg)  03/11/19 204 lb 4 oz (92.6 kg)  02/11/19 203 lb 3.2 oz (92.2 kg)    Physical Exam: BP 118/80   Pulse 63   Ht 5\' 10"  (1.778 m)   Wt 208 lb (94.3 kg)   SpO2 96%   BMI 29.84 kg/m  Physical Exam  Constitutional: He is oriented to person, place, and time. He  appears well-developed and well-nourished. No distress.  Healthy-appearing.  Well-groomed  HENT:  Head: Normocephalic and atraumatic.  Neck: No hepatojugular reflux present. Carotid bruit is not present.  Cardiovascular: Normal rate, regular rhythm, normal heart sounds and intact distal pulses.  No extrasystoles are present. PMI is not displaced. Exam reveals no friction rub.  No murmur heard. Pulmonary/Chest: No respiratory distress.  Neurological: He is alert and oriented to person, place, and time.  Psychiatric: He has a normal mood and affect. His behavior is normal. Judgment and thought content normal.  Vitals reviewed.    Adult ECG Report n/a  Recent Labs: From June 2020-TC 164, TG 78, HDL 40, LDL 78.  CR 1.02.  K+ 4.1. No results found for: CHOL, HDL, LDLCALC, LDLDIRECT, TRIG, CHOLHDL No results found for: CREATININE, BUN, NA, K, CL, CO2 No results found for: TSH  ASSESSMENT/PLAN    Problem List Items Addressed This Visit    Family history of premature CAD - Primary (Chronic)   Atypical chest pain     Francisco Vincent has a pretty significant family history of CAD, but with very low risk coronary calcium score and his level of activity, he seems to be relatively low risk for having significant CAD.  My recommendation was for him to continue his diet and exercise trying to lose weight and get his cholesterol down.  Once he is at target with LDL between 70-100 or lower, he should be at decreased risk.   He can continue statin, but I do not think he needs a beta-blocker or ACE inhibitor given his current blood pressures.  I suspect that he was having some discomfort in the past that is no longer there and he is doing well with his exercise.  No medication changes.  Continue statin.  Follow-up as needed.  COVID-19 Education: The signs and symptoms of COVID-19 were discussed with the patient and how to seek care for testing (follow up with PCP or arrange E-visit).   The importance  of social distancing was discussed today.  I spent a total of Gaspar Garbe with the patient. >  50% of the time was spent in direct patient consultation.  Additional time spent with chart review  / charting (studies, outside notes, etc): 4 Total Time: 16 min   Current medicines are reviewed at length with the patient today.  (+/- concerns) n/a   Patient Instructions / Medication Changes & Studies & Tests Ordered   Patient Instructions  Medication Instructions:  No changes *If you need a refill on your cardiac medications before your next appointment, please call your pharmacy*   Lab Work: Not needed   Testing/Procedures: Not needed   Follow-Up: At [facility], you and your health needs are our priority.  As part of our continuing mission to provide you with exceptional heart care, we have created designated Provider Care Teams.  These Care Teams include  your primary Cardiologist (physician) and Advanced Practice Providers (APPs -  Physician Assistants and Nurse Practitioners) who all work together to provide you with the care you need, when you need it.  We recommend signing up for the patient portal called "MyChart".  Sign up information is provided on this After Visit Summary.  MyChart is used to connect with patients for Virtual Visits (Telemedicine).  Patients are able to view lab/test results, encounter notes, upcoming appointments, etc.  Non-urgent messages can be sent to your provider as well.   To learn more about what you can do with MyChart, go to [email or web address].    Your next appointment:   As needed   The format for your next appointment:   Either In Person or Virtual  Provider:   Bryan Lemma, MD   Other Instructions n/a    Studies Ordered:   No orders of the defined types were placed in this encounter.    Bryan Lemma, M.D., M.S. Interventional Cardiologist   Pager # [phone] Phone # [phone] [address] [place], [place] [address]   Thank you for choosing Heartcare at [facility]!!

## 2019-10-14 NOTE — Patient Instructions (Signed)
Medication Instructions:  No changes *If you need a refill on your cardiac medications before your next appointment, please call your pharmacy*   Lab Work: Not needed   Testing/Procedures: Not needed   Follow-Up: At [facility], you and your health needs are our priority.  As part of our continuing mission to provide you with exceptional heart care, we have created designated Provider Care Teams.  These Care Teams include your primary Cardiologist (physician) and Advanced Practice Providers (APPs -  Physician Assistants and Nurse Practitioners) who all work together to provide you with the care you need, when you need it.  We recommend signing up for the patient portal called "MyChart".  Sign up information is provided on this After Visit Summary.  MyChart is used to connect with patients for Virtual Visits (Telemedicine).  Patients are able to view lab/test results, encounter notes, upcoming appointments, etc.  Non-urgent messages can be sent to your provider as well.   To learn more about what you can do with MyChart, go to [email or web address].    Your next appointment:   As needed   The format for your next appointment:   Either In Person or Virtual  Provider:   Bryan Lemma, MD   Other Instructions n/a

## 2019-10-16 ENCOUNTER — Encounter: Payer: Self-pay | Admitting: Cardiology

## 2021-04-05 ENCOUNTER — Emergency Department (HOSPITAL_COMMUNITY): Payer: No Typology Code available for payment source

## 2021-04-05 ENCOUNTER — Encounter (HOSPITAL_COMMUNITY): Payer: Self-pay

## 2021-04-05 ENCOUNTER — Emergency Department (HOSPITAL_COMMUNITY)
Admission: EM | Admit: 2021-04-05 | Discharge: 2021-04-05 | Disposition: A | Discharge status: Home / Self Care | Payer: No Typology Code available for payment source | Attending: Emergency Medicine | Admitting: Emergency Medicine

## 2021-04-05 ENCOUNTER — Other Ambulatory Visit: Payer: Self-pay

## 2021-04-05 DIAGNOSIS — Z87891 Personal history of nicotine dependence: Secondary | ICD-10-CM | POA: Diagnosis not present

## 2021-04-05 DIAGNOSIS — R1013 Epigastric pain: Secondary | ICD-10-CM | POA: Diagnosis not present

## 2021-04-05 DIAGNOSIS — R1011 Right upper quadrant pain: Secondary | ICD-10-CM | POA: Diagnosis not present

## 2021-04-05 DIAGNOSIS — R1010 Upper abdominal pain, unspecified: Secondary | ICD-10-CM | POA: Diagnosis present

## 2021-04-05 LAB — CBC WITH DIFFERENTIAL/PLATELET
Abs Immature Granulocytes: 0.03 10*3/uL (ref 0.00–0.07)
Basophils Absolute: 0.1 10*3/uL (ref 0.0–0.1)
Basophils Relative: 1 %
Eosinophils Absolute: 0.6 10*3/uL — ABNORMAL HIGH (ref 0.0–0.5)
Eosinophils Relative: 6 %
HCT: 48.5 % (ref 39.0–52.0)
Hemoglobin: 16.8 g/dL (ref 13.0–17.0)
Immature Granulocytes: 0 %
Lymphocytes Relative: 38 %
Lymphs Abs: 3.5 10*3/uL (ref 0.7–4.0)
MCH: 30.2 pg (ref 26.0–34.0)
MCHC: 34.6 g/dL (ref 30.0–36.0)
MCV: 87.1 fL (ref 80.0–100.0)
Monocytes Absolute: 0.7 10*3/uL (ref 0.1–1.0)
Monocytes Relative: 7 %
Neutro Abs: 4.4 10*3/uL (ref 1.7–7.7)
Neutrophils Relative %: 48 %
Platelets: 242 10*3/uL (ref 150–400)
RBC: 5.57 MIL/uL (ref 4.22–5.81)
RDW: 13.6 % (ref 11.5–15.5)
WBC: 9.2 10*3/uL (ref 4.0–10.5)
nRBC: 0 % (ref 0.0–0.2)

## 2021-04-05 LAB — COMPREHENSIVE METABOLIC PANEL
ALT: 37 U/L (ref 0–44)
AST: 23 U/L (ref 15–41)
Albumin: 4.5 g/dL (ref 3.5–5.0)
Alkaline Phosphatase: 64 U/L (ref 38–126)
Anion gap: 11 (ref 5–15)
BUN: 14 mg/dL (ref 6–20)
CO2: 21 mmol/L — ABNORMAL LOW (ref 22–32)
Calcium: 9.6 mg/dL (ref 8.9–10.3)
Chloride: 105 mmol/L (ref 98–111)
Creatinine, Ser: 0.88 mg/dL (ref 0.61–1.24)
GFR, Estimated: >60 mL/min (ref >60)
Glucose, Bld: 112 mg/dL — ABNORMAL HIGH (ref 70–99)
Potassium: 3.7 mmol/L (ref 3.5–5.1)
Sodium: 137 mmol/L (ref 135–145)
Total Bilirubin: 1 mg/dL (ref 0.3–1.2)
Total Protein: 7.2 g/dL (ref 6.5–8.1)

## 2021-04-05 LAB — URINALYSIS, ROUTINE W REFLEX MICROSCOPIC
Bilirubin Urine: NEGATIVE
Glucose, UA: NEGATIVE mg/dL
Hgb urine dipstick: NEGATIVE
Ketones, ur: NEGATIVE mg/dL
Leukocytes,Ua: NEGATIVE
Nitrite: NEGATIVE
Protein, ur: NEGATIVE mg/dL
Specific Gravity, Urine: 1.014 (ref 1.005–1.030)
pH: 5 (ref 5.0–8.0)

## 2021-04-05 LAB — LIPASE, BLOOD: Lipase: 49 U/L (ref 11–51)

## 2021-04-05 MED ORDER — LIDOCAINE VISCOUS HCL 2 % MT SOLN
15.0000 mL | Freq: Once | OROMUCOSAL | Status: AC
  Administered 2021-04-05: 15 mL via ORAL
  Filled 2021-04-05: qty 15

## 2021-04-05 MED ORDER — ALUM & MAG HYDROXIDE-SIMETH 200-200-20 MG/5ML PO SUSP
30.0000 mL | Freq: Once | ORAL | Status: AC
  Administered 2021-04-05: 30 mL via ORAL
  Filled 2021-04-05: qty 30

## 2021-04-05 MED ORDER — IOHEXOL 350 MG/ML SOLN
80.0000 mL | Freq: Once | INTRAVENOUS | Status: AC | PRN
  Administered 2021-04-05: 80 mL via INTRAVENOUS

## 2021-04-05 NOTE — ED Triage Notes (Signed)
Pt complains of abdominal pain that feels like a muscle ache x a few hours.

## 2021-04-05 NOTE — Discharge Instructions (Addendum)
The source of your symptoms was not identified today.   You had a CT scan performed today that showed a small nodule on your liver. Please follow-up with your family doctor for further evaluation of this.   You may take Pepcid or Zantac, available over-the-counter as needed for abdominal pain.   Get rechecked if you develop fevers, uncontrolled pain or new concerning symptoms.

## 2021-04-05 NOTE — ED Provider Notes (Signed)
[facility] [place] [facility]-EMERGENCY DEPT Provider Note   CSN: [id number] Arrival date & time: 04/05/21  0049     History Chief Complaint  Patient presents with   Abdominal Pain    Francisco Vincent is a 55 y.o. male.  The history is provided by the patient and medical records.  Abdominal Pain Francisco Vincent is a 55 y.o. male who presents to the Emergency Department complaining of abdominal pain. He presents the emergency department for evaluation of upper abdominal pain that started around midnight and will confirm sleep. Pain is described as a muscle pain that does not change with positioning or activity. No associated fever, chest pain, shortness of breath, nausea, vomiting, diarrhea, constipation, dysuria. No prior similar symptoms. He has a history of hyperlipidemia. No tobacco. Occasional alcohol use. No street drug use. No prior abdominal surgeries. No prior similar symptoms.    Past Medical History:  Diagnosis Date   Hyperlipidemia    Has been on pravastatin for over 20 years after his father underwent CABG   Rosacea     Patient Active Problem List   Diagnosis Date Noted   Atypical chest pain 02/11/2019   Family history of premature CAD 02/11/2019    Past Surgical History:  Procedure Laterality Date   TONSILLECTOMY     TYMPANOSTOMY TUBE PLACEMENT     VASECTOMY         Family History  Problem Relation Age of Onset   Dementia Mother    Narcolepsy Mother    CAD Father 60       CABG @ 78 (had checkup b/c Mother's CAD) -- now s/p PCI as well   CAD Maternal Grandmother    CAD Maternal Grandfather    CAD Paternal Grandfather     Social History   Tobacco Use   Smoking status: Former   Smokeless tobacco: Never  Building services engineer Use: Never used  Substance Use Topics   Alcohol use: Yes   Drug use: Never    Home Medications Prior to Admission medications   Medication Sig Start Date End Date Taking? Authorizing Provider  Calcium Polycarbophil  (FIBER-CAPS PO) Take by mouth.    [provider]  fluticasone (FLONASE) 50 MCG/ACT nasal spray Place 2 sprays into both nostrils as needed.     [provider]  Loratadine (CLARITIN) 10 MG CAPS Take by mouth.    [provider]  Multiple Vitamin (MULTIVITAMIN) tablet Take 1 tablet by mouth daily.    [provider]  Omega-3 Fatty Acids (FISH OIL BURP-LESS PO) Take by mouth.    [provider]  pravastatin (PRAVACHOL) 40 MG tablet Take 40 mg by mouth daily.    [provider]    Allergies    Penicillins  Review of Systems   Review of Systems  Gastrointestinal:  Positive for abdominal pain.  All other systems reviewed and are negative.  Physical Exam Updated Vital Signs BP 123/83   Pulse (!) 51   Temp 97.9 F (36.6 C) (Oral)   Resp 16   Ht 5\' 10"  (1.778 m)   Wt 94.3 kg   SpO2 96%   BMI 29.84 kg/m   Physical Exam Vitals and nursing note reviewed.  Constitutional:      Appearance: He is well-developed.  HENT:     Head: Normocephalic and atraumatic.  Cardiovascular:     Rate and Rhythm: Normal rate and regular rhythm.     Heart sounds: No murmur heard. Pulmonary:  Effort: Pulmonary effort is normal. No respiratory distress.     Breath sounds: Normal breath sounds.  Abdominal:     Palpations: Abdomen is soft.     Tenderness: There is no abdominal tenderness. There is no guarding or rebound.  Musculoskeletal:        General: No swelling or tenderness.  Skin:    General: Skin is warm and dry.  Neurological:     Mental Status: He is alert and oriented to person, place, and time.  Psychiatric:        Behavior: Behavior normal.    ED Results / Procedures / Treatments   Labs (all labs ordered are listed, but only abnormal results are displayed) Labs Reviewed  COMPREHENSIVE METABOLIC PANEL - Abnormal; Notable for the following components:      Result Value   CO2 21 (*)    Glucose, Bld 112 (*)    All other  components within normal limits  CBC WITH DIFFERENTIAL/PLATELET - Abnormal; Notable for the following components:   Eosinophils Absolute 0.6 (*)    All other components within normal limits  LIPASE, BLOOD  URINALYSIS, ROUTINE W REFLEX MICROSCOPIC    EKG EKG Interpretation  Date/Time:  Monday April 05 2021 01:40:05 EDT Ventricular Rate:  55 PR Interval:  185 QRS Duration: 95 QT Interval:  410 QTC Calculation: 393 R Axis:   -20 Text Interpretation: Sinus rhythm Borderline left axis deviation No previous ECGs available Confirmed by Tilden Fossa [phone]) on 04/05/2021 1:53:02 AM  Radiology CT Abdomen Pelvis W Contrast  Result Date: 04/05/2021 CLINICAL DATA:  Abdominal pain. EXAM: CT ABDOMEN AND PELVIS WITH CONTRAST TECHNIQUE: Multidetector CT imaging of the abdomen and pelvis was performed using the standard protocol following bolus administration of intravenous contrast. CONTRAST:  42mL OMNIPAQUE IOHEXOL 350 MG/ML SOLN COMPARISON:  None. FINDINGS: Lower chest: Minimal bibasilar atelectasis. The visualized lung bases are otherwise clear. No intra-abdominal free air or free fluid. Hepatobiliary: Indeterminate 15 x 13 mm enhancing focus in the right lobe of the liver (19/2) may represent a flash filling hemangioma or portal venous shunting. Further characterization with MRI without and with contrast on a nonemergent/outpatient basis recommended. No intrahepatic biliary dilatation. No calcified gallstone. There is mild pericholecystic haziness. Ultrasound may provide better evaluation of the gallbladder if there is clinical concern for acute cholecystitis. Pancreas: Unremarkable. No pancreatic ductal dilatation or surrounding inflammatory changes. Spleen: Normal in size without focal abnormality. Adrenals/Urinary Tract: The adrenal glands are unremarkable. The kidneys, visualized ureters, and urinary bladder appear unremarkable. Stomach/Bowel: There is sigmoid diverticulosis without active  inflammatory changes. There is no bowel obstruction or active inflammation. The appendix is normal. Vascular/Lymphatic: The abdominal aorta and IVC are unremarkable. No portal venous gas. There is no adenopathy. Reproductive: The prostate and seminal vesicles are grossly unremarkable. No pelvic mass. Other: Small fat containing umbilical hernia. Musculoskeletal: No acute or significant osseous findings. IMPRESSION: 1. Mild pericholecystic haziness. No calcified gallstone. Ultrasound may provide better evaluation of the gallbladder if there is clinical concern for acute cholecystitis. 2. Sigmoid diverticulosis. No bowel obstruction. Normal appendix. 3. Indeterminate 15 mm enhancing nodule in the right lobe of the liver. Further evaluation with MRI on a nonemergent/outpatient basis recommended. Electronically Signed   By: Elgie Collard M.D.   On: 04/05/2021 03:53   US Abdomen Limited RUQ (LIVER/GB)  Result Date: 04/05/2021 CLINICAL DATA:  Right upper quadrant pain EXAM: ULTRASOUND ABDOMEN LIMITED RIGHT UPPER QUADRANT COMPARISON:  CT of the abdomen from earlier today FINDINGS: Gallbladder: Borderline  wall thickening but no focal tenderness and no calculus noted. Common bile duct: Diameter: 4 mm Liver: The patient's hypervascular nodule in the right liver by CT was not discretely visualized on this scan. There is an area of geographic increased echogenicity at the gallbladder fossa which has a non masslike appearance by CT. Portal vein is patent on color Doppler imaging with normal direction of blood flow towards the liver. IMPRESSION: 1. Negative for cholelithiasis or acute cholecystitis. 2. The right liver nodule by preceding CT was not visualized/characterized sonographically. Electronically Signed   By: Marnee Spring M.D.   On: 04/05/2021 05:24    Procedures Procedures   Medications Ordered in ED Medications  alum & mag hydroxide-simeth (MAALOX/MYLANTA) 200-200-20 MG/5ML suspension 30 mL (30 mLs Oral  Given 04/05/21 0136)    And  lidocaine (XYLOCAINE) 2 % viscous mouth solution 15 mL (15 mLs Oral Given 04/05/21 0136)  iohexol (OMNIPAQUE) 350 MG/ML injection 80 mL (80 mLs Intravenous Contrast Given 04/05/21 0331)    ED Course  I have reviewed the triage vital signs and the nursing notes.  Pertinent labs & imaging results that were available during my care of the patient were reviewed by me and considered in my medical decision making (see chart for details).    MDM Rules/Calculators/A&P                          patient here for evaluation of upper abdominal pain. He is significant pain but this is not reproducible on examination. Labs are reassuring with normal CBC, lipase.  Given ongoing pain a CT abdomen pelvis was obtained. CT is negative for bowel obstruction, intra-abdominal abscess. CT does demonstrate possible cholecystic stranding. Right upper quadrant ultrasound was obtained, which is negative for cholecystitis. On reassessment following ultrasound patient states his pain has completely resolved. Discussed with patient unclear source of symptoms, possible indigestion. Discussed incidental finding of liver nodule. Discussed PCP follow-up and return precautions.  Final Clinical Impression(s) / ED Diagnoses Final diagnoses:  RUQ abdominal pain  Epigastric pain    Rx / DC Orders ED Discharge Orders     None        Tilden Fossa, MD 04/05/21 [phone]

## 2021-04-13 ENCOUNTER — Other Ambulatory Visit: Payer: Self-pay | Admitting: Family Medicine

## 2021-04-13 DIAGNOSIS — K769 Liver disease, unspecified: Secondary | ICD-10-CM

## 2021-04-28 ENCOUNTER — Ambulatory Visit
Admission: RE | Admit: 2021-04-28 | Discharge: 2021-04-28 | Disposition: A | Discharge status: Home / Self Care | Payer: No Typology Code available for payment source | Source: Ambulatory Visit | Attending: Family Medicine | Admitting: Family Medicine

## 2021-04-28 ENCOUNTER — Other Ambulatory Visit: Payer: Self-pay

## 2021-04-28 DIAGNOSIS — K769 Liver disease, unspecified: Secondary | ICD-10-CM

## 2021-04-28 MED ORDER — GADOBENATE DIMEGLUMINE 529 MG/ML IV SOLN
19.0000 mL | Freq: Once | INTRAVENOUS | Status: AC | PRN
  Administered 2021-04-28: 19 mL via INTRAVENOUS

## 2021-11-19 IMAGING — CT CT ABD-PELV W/ CM
2 of 5 series · 15 of 46 positions shown, 17 images · IV contrast (omnipaque)
Comparison: None.

CLINICAL DATA: Abdominal pain.

EXAM:
CT ABDOMEN AND PELVIS WITH CONTRAST
TECHNIQUE: Multidetector CT imaging of the abdomen and pelvis was performed
using the standard protocol following bolus administration of
intravenous contrast.
CONTRAST:  80mL OMNIPAQUE IOHEXOL 350 MG/ML SOLN

[Series 2: axial st · axial · 0.86mm/px · z∈[-574,-124]mm · 12 of 106 slices shown, 14 images]
[im 8/106  soft-tissue]
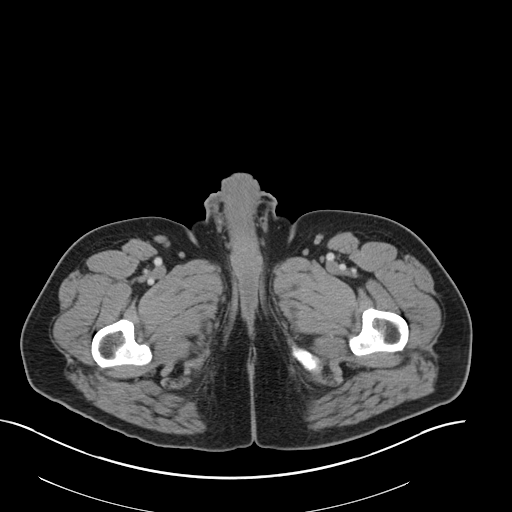
[im 8/106  bone]
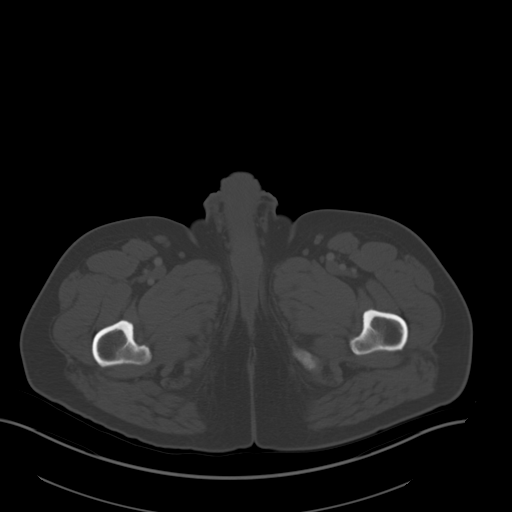
[im 16/106  soft-tissue]
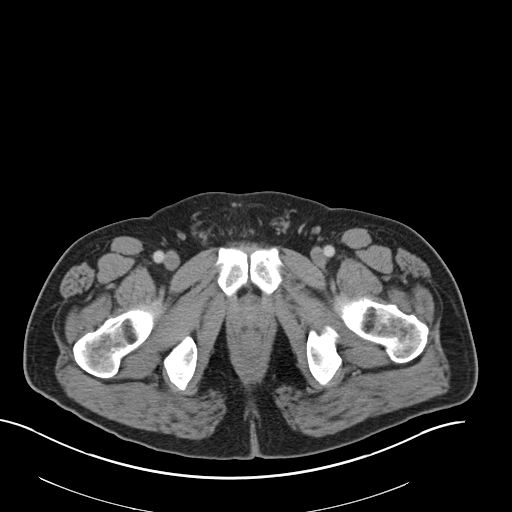
[im 23/106  soft-tissue]
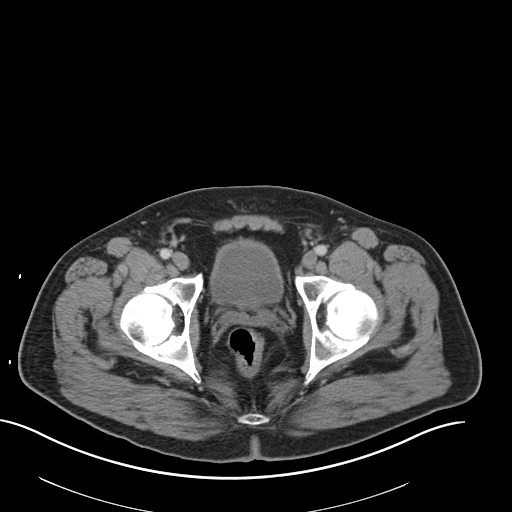
[im 31/106  soft-tissue]
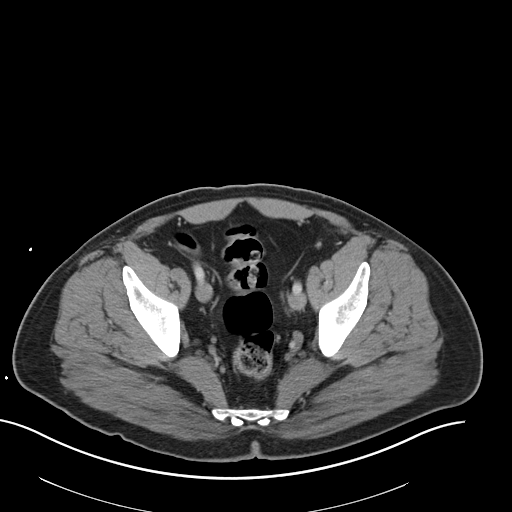
[im 38/106  soft-tissue]
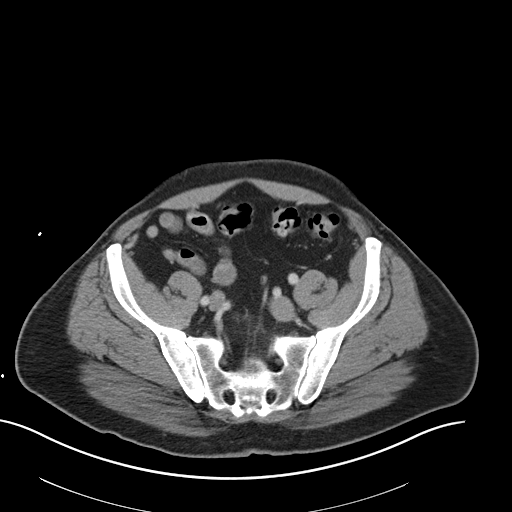
[im 46/106  soft-tissue]
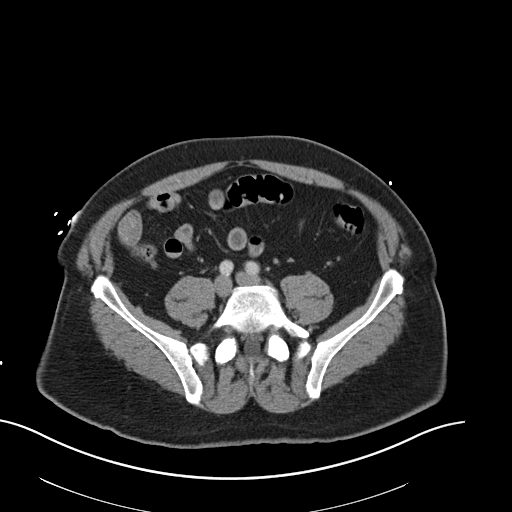
[im 61/106  soft-tissue]
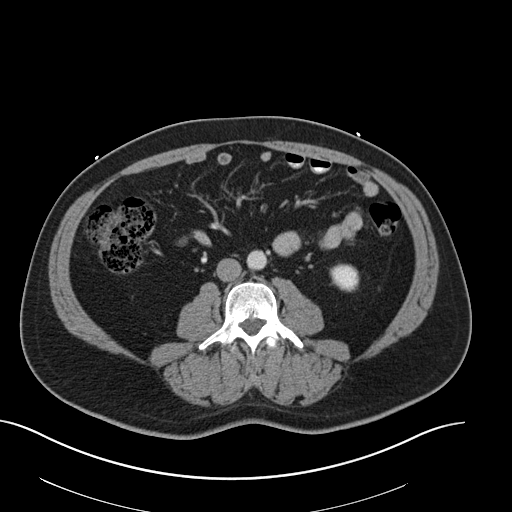
[im 68/106  soft-tissue]
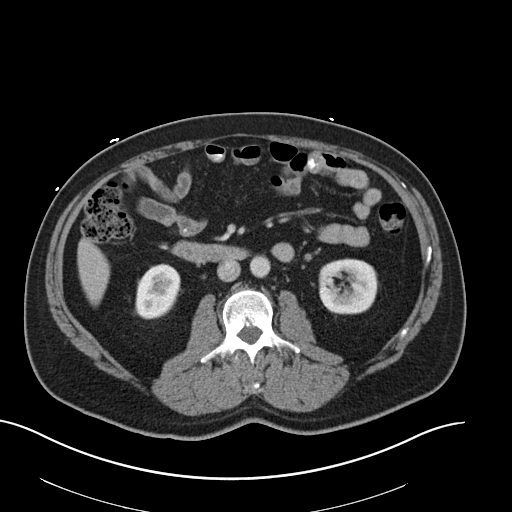
[im 76/106  soft-tissue]
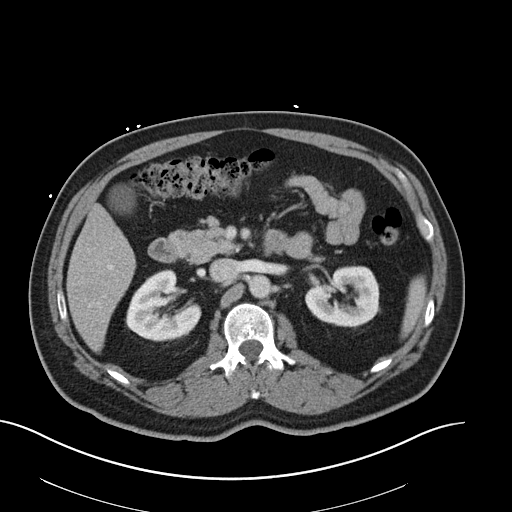
[im 76/106  bone]
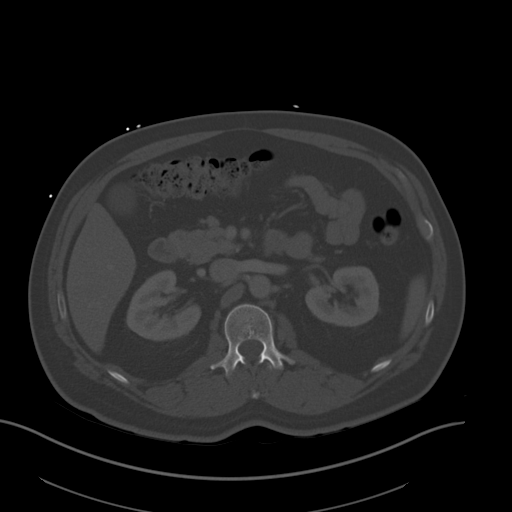
[im 83/106  soft-tissue]
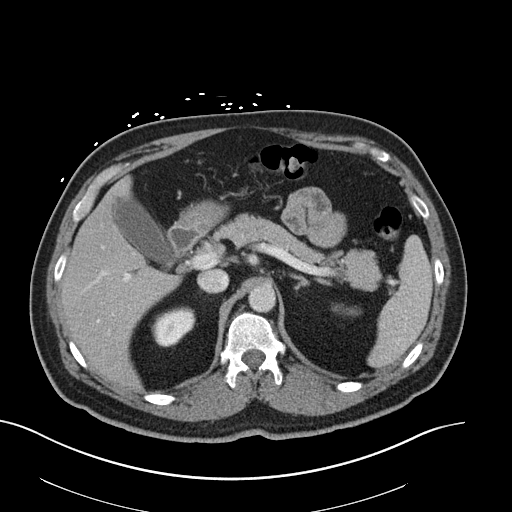
[im 91/106  soft-tissue]
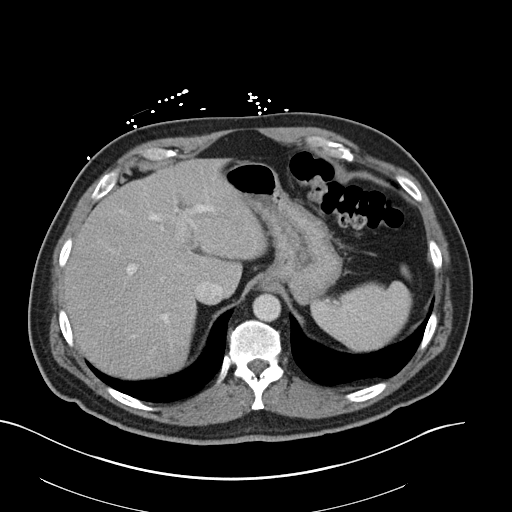
[im 98/106  soft-tissue]
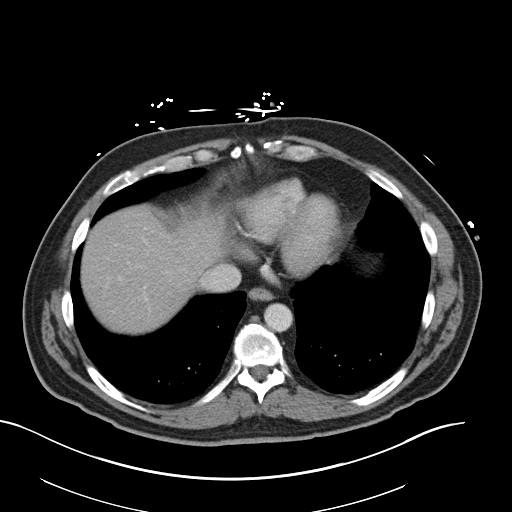

[Series 6: coronal st · coronal · 0.85mm/px · 3 of 172 slices shown]
[im 58/172  soft-tissue]
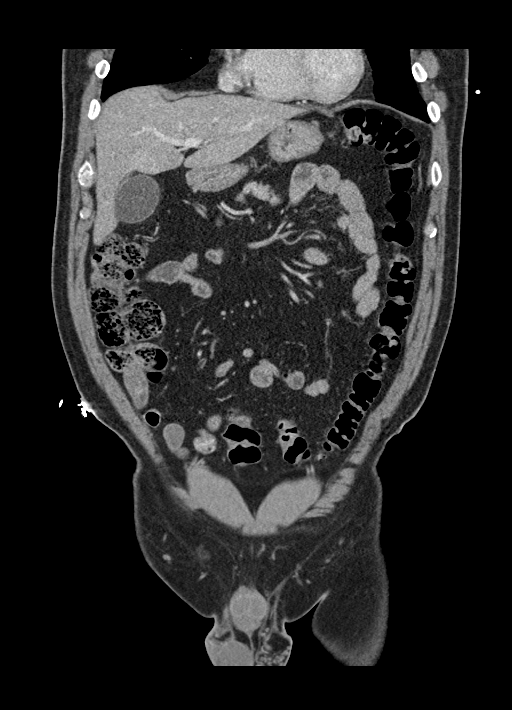
[im 77/172  soft-tissue]
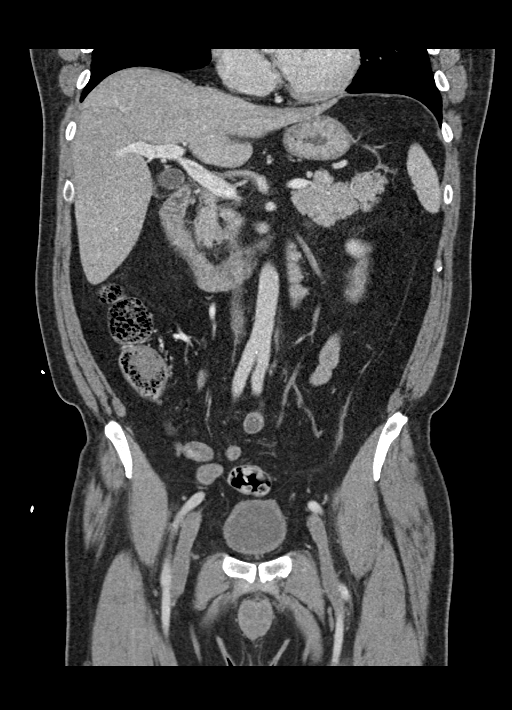
[im 96/172  soft-tissue]
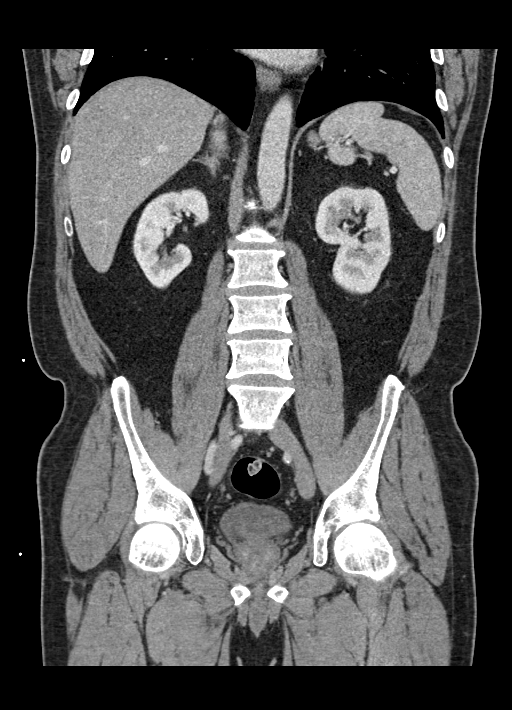

[15 of 46 positions shown; findings below may reference images not displayed]

FINDINGS: Lower chest: Minimal bibasilar atelectasis. The visualized lung
bases are otherwise clear.

No intra-abdominal free air or free fluid.

Hepatobiliary: Indeterminate 15 x 13 mm enhancing focus in the right
lobe of the liver ([DATE]) may represent a flash filling hemangioma or
portal venous shunting. Further characterization with MRI without
and with contrast on a nonemergent/outpatient basis recommended. No
intrahepatic biliary dilatation. No calcified gallstone. There is
mild pericholecystic haziness. Ultrasound may provide better
evaluation of the gallbladder if there is clinical concern for acute
cholecystitis.

Pancreas: Unremarkable. No pancreatic ductal dilatation or
surrounding inflammatory changes.

Spleen: Normal in size without focal abnormality.

Adrenals/Urinary Tract: The adrenal glands are unremarkable. The
kidneys, visualized ureters, and urinary bladder appear
unremarkable.

Stomach/Bowel: There is sigmoid diverticulosis without active
inflammatory changes. There is no bowel obstruction or active
inflammation. The appendix is normal.

Vascular/Lymphatic: The abdominal aorta and IVC are unremarkable. No
portal venous gas. There is no adenopathy.

Reproductive: The prostate and seminal vesicles are grossly
unremarkable. No pelvic mass.

Other: Small fat containing umbilical hernia.

Musculoskeletal: No acute or significant osseous findings.
IMPRESSION: 1. Mild pericholecystic haziness. No calcified gallstone. Ultrasound
may provide better evaluation of the gallbladder if there is
clinical concern for acute cholecystitis.
2. Sigmoid diverticulosis. No bowel obstruction. Normal appendix.
3. Indeterminate 15 mm enhancing nodule in the right lobe of the
liver. Further evaluation with MRI on a nonemergent/outpatient basis
recommended.

## 2021-11-19 IMAGING — US US ABDOMEN LIMITED
1 series · 15 of 25 positions shown · non-contrast
Comparison: CT of the abdomen from earlier today

CLINICAL DATA: Right upper quadrant pain

EXAM:
ULTRASOUND ABDOMEN LIMITED RIGHT UPPER QUADRANT

[Series 1: us abdomen limited ruq mc & wl · 15 of 59 slices shown]
[im 1/59]
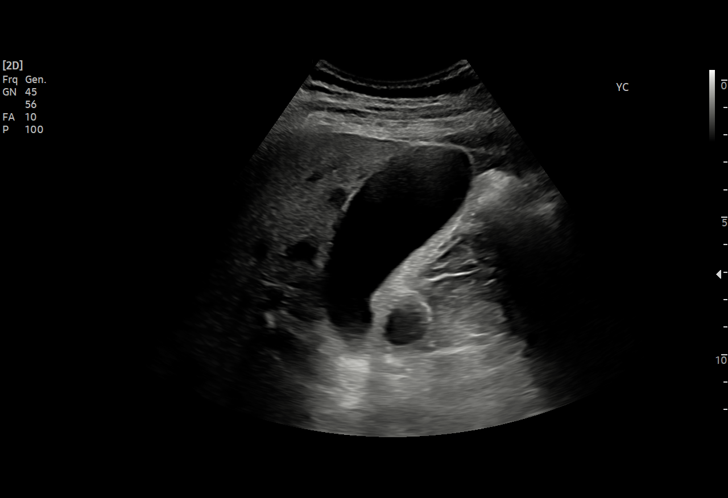
[im 5/59]
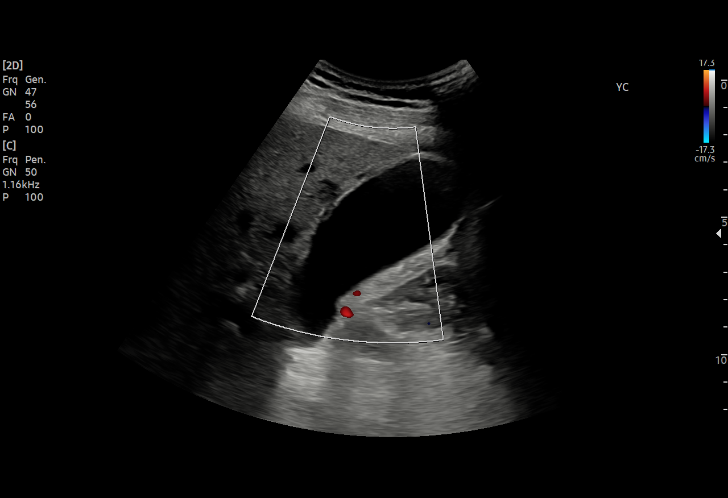
[im 10/59]
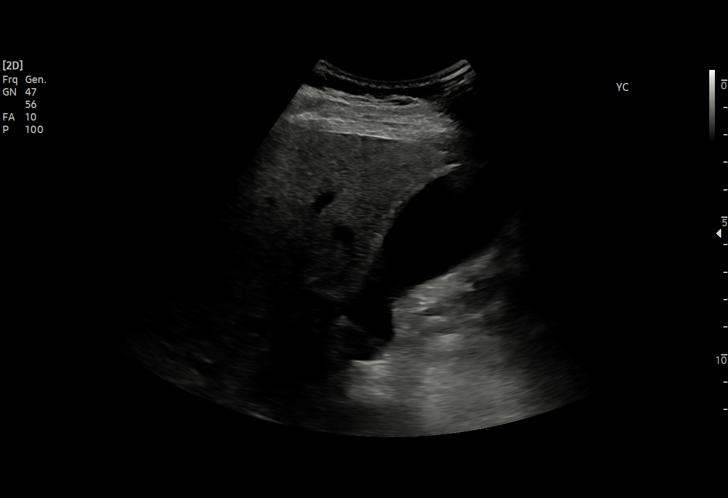
[im 13/59]
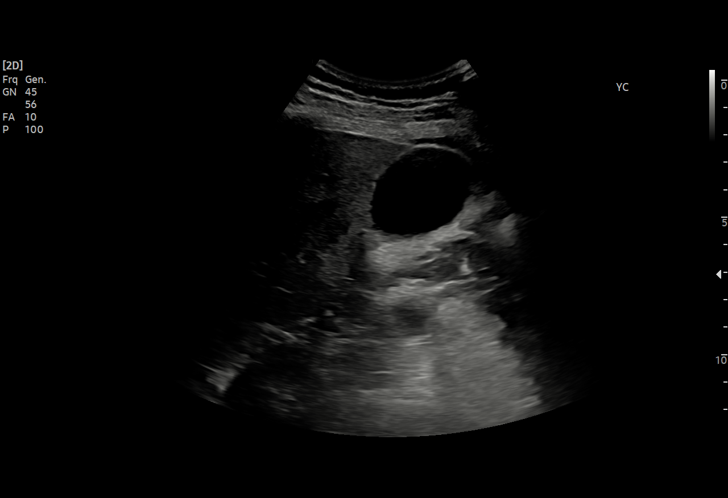
[im 17/59]
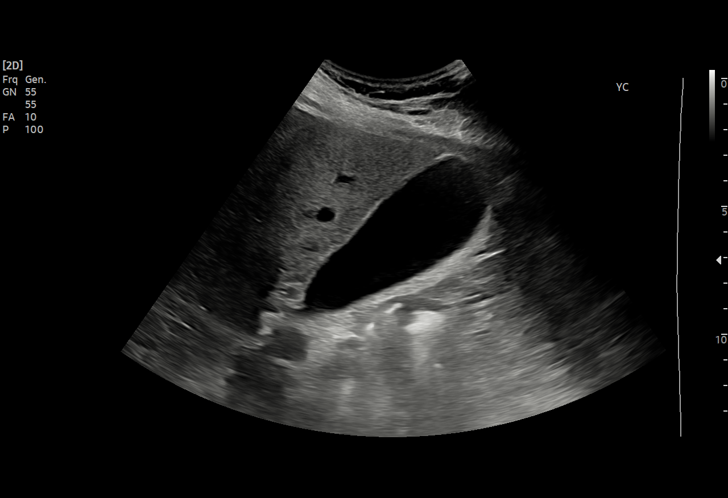
[im 22/59]
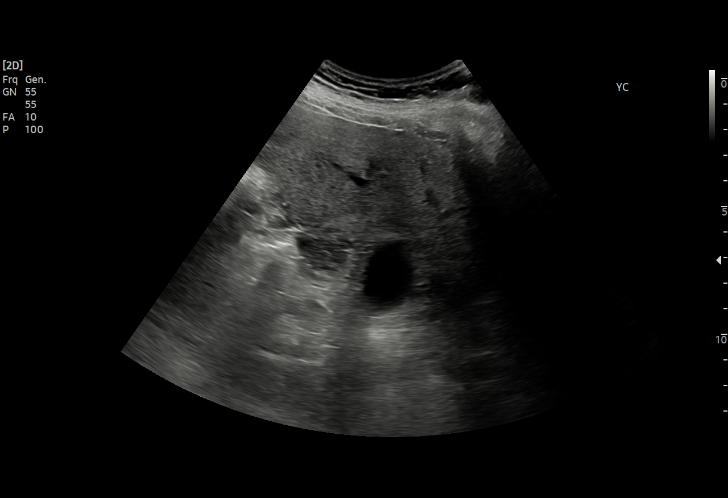
[im 25/59]
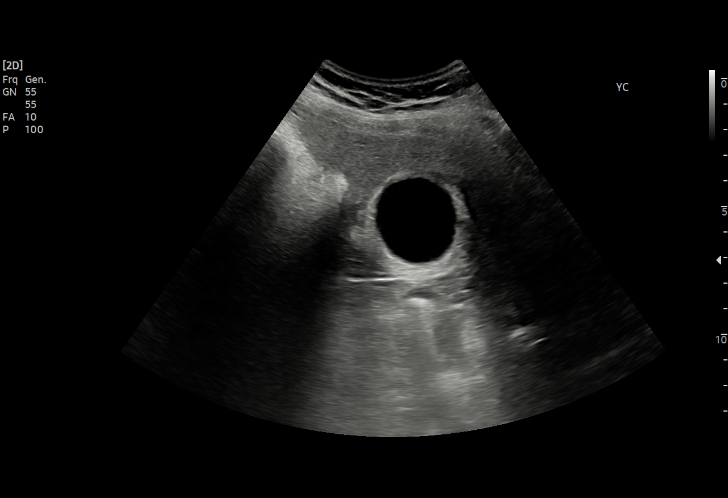
[im 30/59]
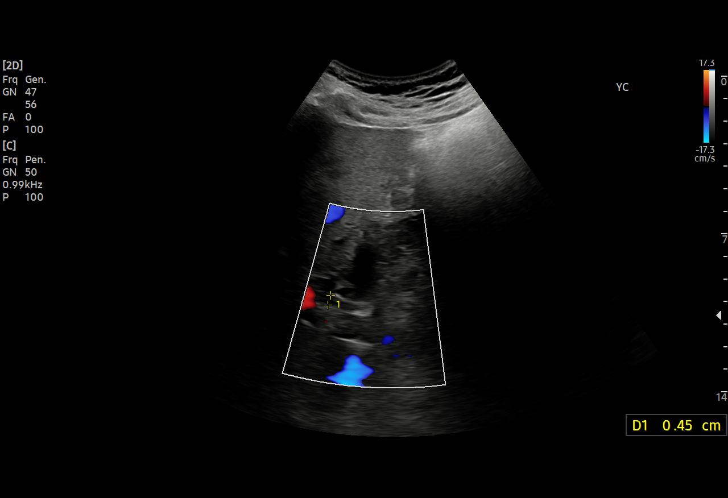
[im 34/59]
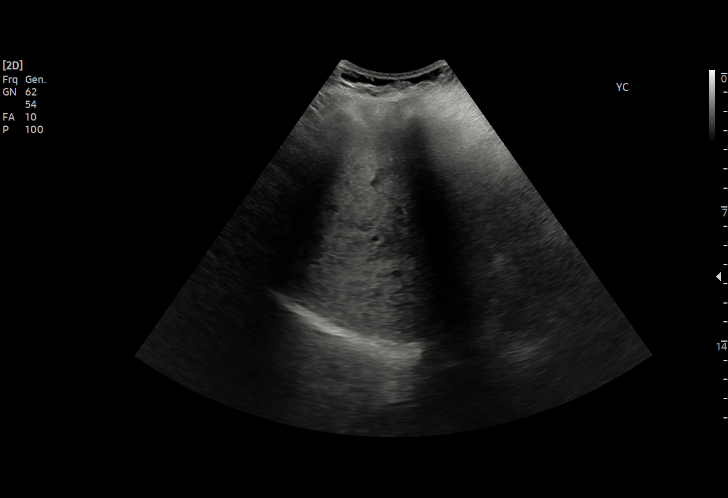
[im 37/59]
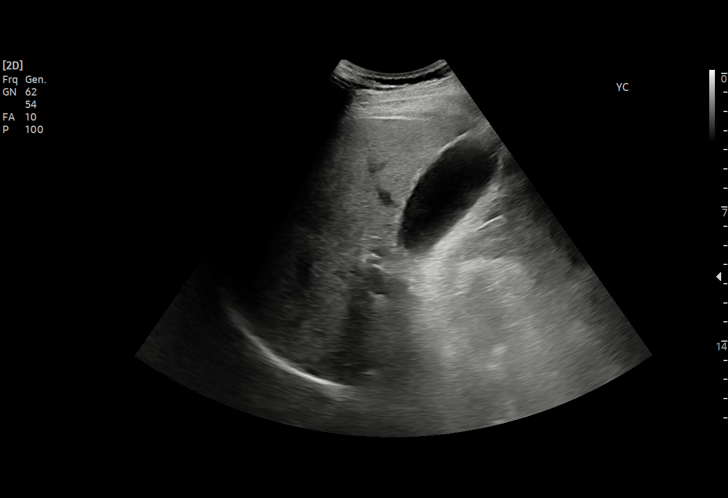
[im 42/59]
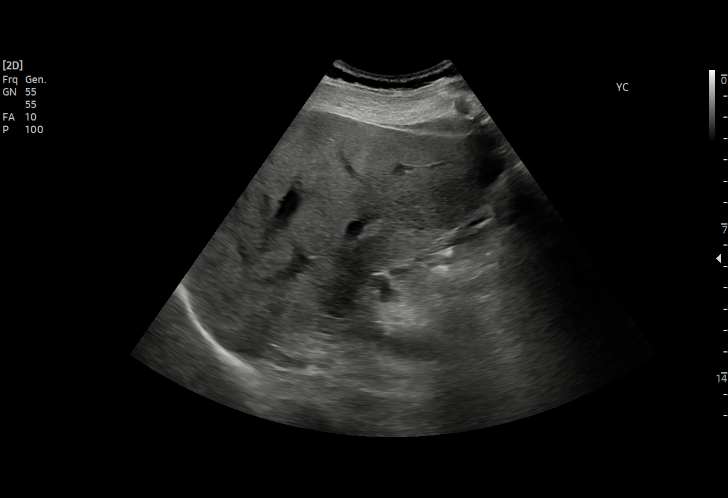
[im 46/59]
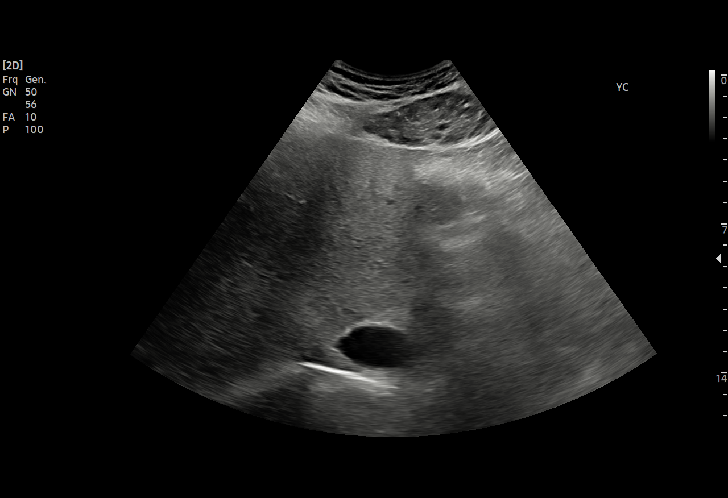
[im 49/59]
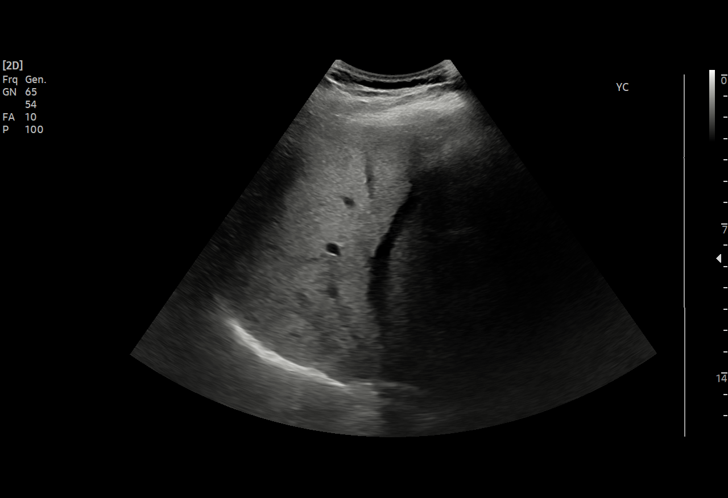
[im 54/59]
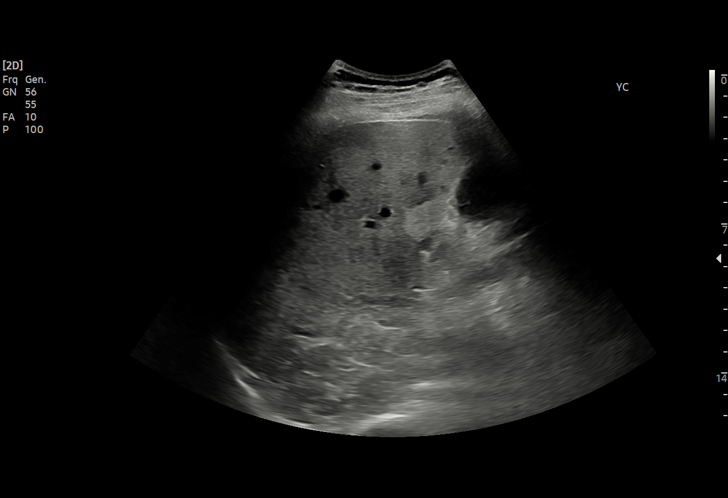
[im 59/59]
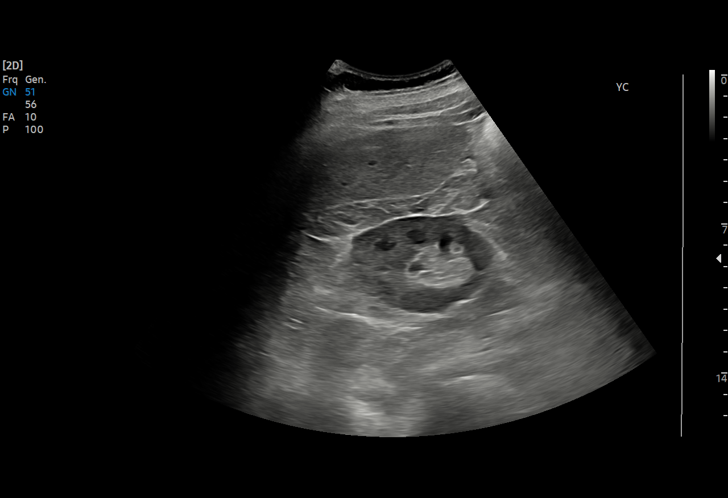

[15 of 25 positions shown; findings below may reference images not displayed]

FINDINGS: Gallbladder:

Borderline wall thickening but no focal tenderness and no calculus
noted.

Common bile duct:

Diameter: 4 mm

Liver:

The patient's hypervascular nodule in the right liver by CT was not
discretely visualized on this scan. There is an area of geographic
increased echogenicity at the gallbladder fossa which has a non
masslike appearance by CT. Portal vein is patent on color Doppler
imaging with normal direction of blood flow towards the liver.
IMPRESSION: 1. Negative for cholelithiasis or acute cholecystitis.
2. The right liver nodule by preceding CT was not
visualized/characterized sonographically.

## 2024-04-04 NOTE — Progress Notes (Signed)
 [facility] CONSULT NOTE  Patient Care Team: Gib Charleston, MD as PCP - General (Family Medicine)  ASSESSMENT & PLAN 58 y.o.male with history of smoking quit in 2010, hyperlipidemia, allergic rhinitis, liver hemangioma referred to [facility] for polycythemia.  Recent hemoglobin was 17.  There is no testosterone use, smoking, COPD or emphysema.  The etiology is not clear at this time and requires further testing and investigation.  Of note, report liver hemangioma.  We will obtain EPO along with JAK2 mutation..  Assessment & Plan Polycythemia EPO and JAK2 reflex Ferritin, CBC, CMP, LDH Urinalysis Repeat ultrasound abdomen to evaluate hemangioma versus liver lesion, and rule out kidney lesion Return in a few weeks to go over results and further recommendations  Thank you for the consult.  CHIEF COMPLAINTS/PURPOSE OF CONSULTATION:  Erythrocytosis  HISTORY OF PRESENTING ILLNESS:  Francisco Vincent 58 y.o. male is here because of elevated hemoglobin.  From recent records dated:  01/22/23 hemoglobin 16 MCV 93 WBC 5.8 platelet 263 AST 33 ALT 272 total bilirubin 0.8 02/10/2023 hemoglobin 16 MCV 92 WBC 6 platelet 244 AST 20 ALT 58 total bilirubin 1.3 02/21/2024 hemoglobin 17 MCV 88 WBC 6.9 platelet 240.  ALT 54 total bilirubin 1.4 03/11/2024 hemoglobin 17 MCV 90 WBC 6.9 platelet 235  He smoked about 2 pack a week for 20 years quitted in 2010.  He snores. He has not been checked for sleep apnea.  He drinks about 6-7 glasses a week of liquor.  Patient denies history of heavy cigarette smoking, COPD, asthma, use of steroid, hematuria  There has been no symptoms of pruritus after bathing, erythromelalgia, gout, thrombosis, early satiety.  No night sweats, unexpected weight loss, early satiety.  MEDICAL HISTORY:  Past Medical History:  Diagnosis Date   Hyperlipidemia    Has been on pravastatin for over 20 years after his father underwent CABG    Rosacea     SURGICAL HISTORY: Past Surgical History:  Procedure Laterality Date   TONSILLECTOMY     TYMPANOSTOMY TUBE PLACEMENT     VASECTOMY      SOCIAL HISTORY: Social History   Socioeconomic History   Marital status: Married    Spouse name: Not on file   Number of children: Not on file   Years of education: Not on file   Highest education level: Not on file  Occupational History    Employer: [employer]  Tobacco Use   Smoking status: Former   Smokeless tobacco: Never  Vaping Use   Vaping status: Never Used  Substance and Sexual Activity   Alcohol use: Yes   Drug use: Never   Sexual activity: Not on file  Other Topics Concern   Not on file  Social History Narrative   Is a very active gentleman tries to get routine exercise.  Smoked in college, but not since.   Social Drivers of Corporate investment banker Strain: Low Risk  (02/24/2022)   Received from [facility]   Overall Financial Resource Strain (CARDIA)    Difficulty of Paying Living Expenses: Not hard at all  Food Insecurity: No Food Insecurity (02/24/2022)   Received from [facility]   Hunger Vital Sign    Within the past 12 months, you worried that your food would run out before you got the money to buy more.: Never true    Within the past 12 months, the food you bought just didn't last and you didn't have money to get more.:  Never true  Transportation Needs: Not on file  Physical Activity: Insufficiently Active (02/24/2022)   Received from [facility]   Exercise Vital Sign    On average, how many days per week do you engage in moderate to strenuous exercise (like a brisk walk)?: 2 days    On average, how many minutes do you engage in exercise at this level?: 40 min  Stress: No Stress Concern Present (02/24/2022)   Received from [facility] - Occupational Stress Questionnaire    Feeling of Stress : Not at all  Social Connections: Socially Integrated  (02/24/2022)   Received from [facility]   Social Network    How would you rate your social network (family, work, friends)?: Good participation with social networks  Intimate Partner Violence: Not At Risk (02/24/2022)   Received from [facility]   HITS    Over the last 12 months how often did your partner physically hurt you?: Never    Over the last 12 months how often did your partner insult you or talk down to you?: Never    Over the last 12 months how often did your partner threaten you with physical harm?: Never    Over the last 12 months how often did your partner scream or curse at you?: Never    FAMILY HISTORY: Family History  Problem Relation Age of Onset   Dementia Mother    Narcolepsy Mother    CAD Father 18       CABG @ 101 (had checkup b/c Mother's CAD) -- now s/p PCI as well   CAD Maternal Grandmother    CAD Maternal Grandfather    CAD Paternal Grandfather     ALLERGIES:  is allergic to penicillins.  MEDICATIONS:  Current Outpatient Medications  Medication Sig Dispense Refill   Calcium Polycarbophil (FIBER-CAPS PO) Take by mouth.     fluticasone (FLONASE) 50 MCG/ACT nasal spray Place 2 sprays into both nostrils as needed.      Loratadine (CLARITIN) 10 MG CAPS Take by mouth.     Multiple Vitamin (MULTIVITAMIN) tablet Take 1 tablet by mouth daily.     pravastatin (PRAVACHOL) 40 MG tablet Take 40 mg by mouth daily.     No current facility-administered medications for this visit.    REVIEW OF SYSTEMS:   All relevant systems were reviewed with the patient and are negative.  PHYSICAL EXAMINATION: ECOG PERFORMANCE STATUS: 0 - Asymptomatic  Vitals:   04/05/24 1428  BP: 136/86  Pulse: 72  Resp: 17  Temp: 97.8 F (36.6 C)  SpO2: 98%   Filed Weights   04/05/24 1428  Weight: 213 lb 3.2 oz (96.7 kg)    GENERAL: alert, no distress and comfortable SKIN: skin color normal and no bruising or petechiae on exposed skin EYES: normal, sclera clear OROPHARYNX:  no exudate  LUNGS: clear to auscultation and percussion with normal breathing effort HEART: regular rate & rhythm and no murmurs  ABDOMEN: abdomen soft, non-tender and nondistended. Musculoskeletal: no edema   LABORATORY DATA:  I have reviewed the data as listed No results found for this or any previous visit (from the past 2160 hours).  RADIOGRAPHIC STUDIES: I have personally reviewed the radiological images as listed and agreed with the findings in the report.    Pauletta JAYSON Chihuahua, MD 8/22/20252:54 PM

## 2024-04-05 ENCOUNTER — Inpatient Hospital Stay

## 2024-04-05 VITALS — BP 136/86 | HR 72 | Temp 97.8°F | Resp 17 | Ht 70.0 in | Wt 213.2 lb

## 2024-04-05 DIAGNOSIS — D1809 Hemangioma of other sites: Secondary | ICD-10-CM | POA: Insufficient documentation

## 2024-04-05 DIAGNOSIS — D751 Secondary polycythemia: Secondary | ICD-10-CM

## 2024-04-05 DIAGNOSIS — Z79899 Other long term (current) drug therapy: Secondary | ICD-10-CM | POA: Diagnosis not present

## 2024-04-05 DIAGNOSIS — Z87891 Personal history of nicotine dependence: Secondary | ICD-10-CM | POA: Insufficient documentation

## 2024-04-05 LAB — URINALYSIS, COMPLETE (UACMP) WITH MICROSCOPIC
Bacteria, UA: NONE SEEN
Bilirubin Urine: NEGATIVE
Glucose, UA: NEGATIVE mg/dL
Ketones, ur: NEGATIVE mg/dL
Leukocytes,Ua: NEGATIVE
Nitrite: NEGATIVE
Protein, ur: NEGATIVE mg/dL
Specific Gravity, Urine: 1.006 (ref 1.005–1.030)
pH: 6 (ref 5.0–8.0)

## 2024-04-05 LAB — CMP (CANCER CENTER ONLY)
ALT: 42 U/L (ref 0–44)
AST: 22 U/L (ref 15–41)
Albumin: 4.6 g/dL (ref 3.5–5.0)
Alkaline Phosphatase: 70 U/L (ref 38–126)
Anion gap: 6 (ref 5–15)
BUN: 12 mg/dL (ref 6–20)
CO2: 27 mmol/L (ref 22–32)
Calcium: 9.3 mg/dL (ref 8.9–10.3)
Chloride: 107 mmol/L (ref 98–111)
Creatinine: 0.85 mg/dL (ref 0.61–1.24)
GFR, Estimated: >60 mL/min (ref >60)
Glucose, Bld: 94 mg/dL (ref 70–99)
Potassium: 4.4 mmol/L (ref 3.5–5.1)
Sodium: 140 mmol/L (ref 135–145)
Total Bilirubin: 1.1 mg/dL (ref 0.0–1.2)
Total Protein: 6.8 g/dL (ref 6.5–8.1)

## 2024-04-05 LAB — CBC WITH DIFFERENTIAL (CANCER CENTER ONLY)
Abs Immature Granulocytes: 0.01 K/uL (ref 0.00–0.07)
Basophils Absolute: 0.1 K/uL (ref 0.0–0.1)
Basophils Relative: 1 %
Eosinophils Absolute: 0.5 K/uL (ref 0.0–0.5)
Eosinophils Relative: 7 %
HCT: 46.8 % (ref 39.0–52.0)
Hemoglobin: 16.5 g/dL (ref 13.0–17.0)
Immature Granulocytes: 0 %
Lymphocytes Relative: 38 %
Lymphs Abs: 3 K/uL (ref 0.7–4.0)
MCH: 30.8 pg (ref 26.0–34.0)
MCHC: 35.3 g/dL (ref 30.0–36.0)
MCV: 87.3 fL (ref 80.0–100.0)
Monocytes Absolute: 0.7 K/uL (ref 0.1–1.0)
Monocytes Relative: 9 %
Neutro Abs: 3.6 K/uL (ref 1.7–7.7)
Neutrophils Relative %: 45 %
Platelet Count: 239 K/uL (ref 150–400)
RBC: 5.36 MIL/uL (ref 4.22–5.81)
RDW: 13.5 % (ref 11.5–15.5)
WBC Count: 8 K/uL (ref 4.0–10.5)
nRBC: 0 % (ref 0.0–0.2)

## 2024-04-05 LAB — LACTATE DEHYDROGENASE: LDH: 125 U/L (ref 98–192)

## 2024-04-08 LAB — FERRITIN: Ferritin: 76 ng/mL (ref 24–336)

## 2024-04-10 ENCOUNTER — Ambulatory Visit (HOSPITAL_COMMUNITY)
Admission: RE | Admit: 2024-04-10 | Discharge: 2024-04-10 | Disposition: A | Discharge status: Home / Self Care | Source: Ambulatory Visit

## 2024-04-10 DIAGNOSIS — D751 Secondary polycythemia: Secondary | ICD-10-CM | POA: Diagnosis present

## 2024-04-16 LAB — JAK2 V617F, REFLEX TO E12-15: Reflex: 15

## 2024-04-16 LAB — E12 - E15 (REFLEXED)

## 2024-04-18 ENCOUNTER — Ambulatory Visit: Payer: Self-pay

## 2024-04-22 LAB — ERYTHROPOIETIN

## 2024-05-08 NOTE — Progress Notes (Unsigned)
 [facility] OFFICE PROGRESS NOTE  Patient Care Team: Gib Charleston, MD as PCP - General (Family Medicine)  58 y.o.male with history of smoking quit in 2010, hyperlipidemia, allergic rhinitis, liver hemangioma referred to [facility] for polycythemia.   Recent hemoglobin was 17.  There is no testosterone use, smoking, COPD or emphysema.  Testing showed negative JAK2 reflex, normal EPO, and no signs of renal and liver malignancy. US  reports of a benign hemangioma. Size slightly larger than before. UA with hematuria. Will obtain MRI of abdomen.  Discussed result today.  Will repeat UA for hematuria.  Given the increasing size of liver lesion, obtain MRI for evaluation.  This will also assess kidney as well. Assessment & Plan Liver lesion Increasing in size despite previous reported hemangioma Will obtain MRI for evaluation. If proven hemangioma, may follow-up as needed. If concerning findings we will obtain further workup Polycythemia, secondary Negative JAK2 and reflex Hematuria.  Will repeat UA.  If persistent, urology referral. MRI also to assess kidneys. Recommend referral for sleep apnea evaluation Other microscopic hematuria Repeat UA Refer to urology if persistent MRI of abdomen for liver can also evaluate kidneys    Pauletta JAYSON Chihuahua, MD  INTERVAL HISTORY: Patient returns for follow-up.  Overall feeling well.  No night sweats, fever, chills, unexpected weight loss.  No abdominal pain, chest pain.  No gross hematuria.  PHYSICAL EXAMINATION: ECOG PERFORMANCE STATUS: 0 - Asymptomatic  Vitals:   05/09/24 0903  BP: 131/80  Pulse: (!) 58  Resp: 13  Temp: (!) 97.5 F (36.4 C)  SpO2: 99%   Filed Weights   05/09/24 0903  Weight: 217 lb (98.4 kg)   GENERAL: alert, no distress and comfortable SKIN: skin color normal and no bruising or petechiae or jaundice on exposed skin LYMPH:  no palpable cervical lymphadenopathy  LUNGS: clear to  auscultation and percussion with normal breathing effort HEART: regular rate & rhythm  ABDOMEN: abdomen soft, non-tender and nondistended. Musculoskeletal: no edema   Relevant data reviewed during this visit included labs.  New labs and MRI ordered.

## 2024-05-09 ENCOUNTER — Other Ambulatory Visit: Payer: Self-pay | Admitting: *Deleted

## 2024-05-09 ENCOUNTER — Inpatient Hospital Stay

## 2024-05-09 VITALS — BP 131/80 | HR 58 | Temp 97.5°F | Resp 13 | Wt 217.0 lb

## 2024-05-09 DIAGNOSIS — D751 Secondary polycythemia: Secondary | ICD-10-CM | POA: Insufficient documentation

## 2024-05-09 DIAGNOSIS — R3129 Other microscopic hematuria: Secondary | ICD-10-CM | POA: Diagnosis not present

## 2024-05-09 DIAGNOSIS — R300 Dysuria: Secondary | ICD-10-CM

## 2024-05-09 DIAGNOSIS — K769 Liver disease, unspecified: Secondary | ICD-10-CM | POA: Insufficient documentation

## 2024-05-09 LAB — URINALYSIS, COMPLETE (UACMP) WITH MICROSCOPIC
Bilirubin Urine: NEGATIVE
Glucose, UA: NEGATIVE mg/dL
Hgb urine dipstick: NEGATIVE
Ketones, ur: NEGATIVE mg/dL
Leukocytes,Ua: NEGATIVE
Nitrite: NEGATIVE
Protein, ur: NEGATIVE mg/dL
Specific Gravity, Urine: 1.003 — ABNORMAL LOW (ref 1.005–1.030)
pH: 7 (ref 5.0–8.0)

## 2024-05-09 NOTE — Patient Instructions (Signed)
 Will obtain urine study again today. If persistent hematuria, will see Urology for evaluation.  Will order MRI to assess any changes in the liver lesion. If it's hemangioma, no further work up is needed.  Follow up with PCP to discuss sleep apnea evaluation.  Focus on healthier diet.  May consider GI evaluation for fatty.

## 2024-05-09 NOTE — Assessment & Plan Note (Addendum)
 Negative JAK2 and reflex Hematuria.  Will repeat UA.  If persistent, urology referral. MRI also to assess kidneys. Recommend referral for sleep apnea evaluation

## 2024-05-09 NOTE — Assessment & Plan Note (Addendum)
 Increasing in size despite previous reported hemangioma Will obtain MRI for evaluation. If proven hemangioma, may follow-up as needed. If concerning findings we will obtain further workup

## 2024-05-10 LAB — URINE CULTURE: Culture: NO GROWTH

## 2024-05-13 ENCOUNTER — Ambulatory Visit: Payer: Self-pay

## 2024-05-16 ENCOUNTER — Ambulatory Visit (HOSPITAL_COMMUNITY)
Admission: RE | Admit: 2024-05-16 | Discharge: 2024-05-16 | Disposition: A | Discharge status: Home / Self Care | Source: Ambulatory Visit

## 2024-05-16 DIAGNOSIS — R3129 Other microscopic hematuria: Secondary | ICD-10-CM | POA: Insufficient documentation

## 2024-05-16 DIAGNOSIS — K769 Liver disease, unspecified: Secondary | ICD-10-CM | POA: Diagnosis present

## 2024-05-16 MED ORDER — GADOBUTROL 1 MMOL/ML IV SOLN
10.0000 mL | Freq: Once | INTRAVENOUS | Status: AC | PRN
  Administered 2024-05-16: 10 mL via INTRAVENOUS

## 2024-05-17 ENCOUNTER — Ambulatory Visit: Payer: Self-pay

## 2024-05-17 NOTE — Telephone Encounter (Signed)
-----   Message from Pauletta JAYSON Chihuahua sent at 05/17/2024 10:49 AM EDT ----- Hi Gonsalo Cuthbertson Please let him know good news.  MRI shows stable hemangioma.  No hematuria from recent urinalysis.  No infection.  Therefore he can follow-up with us  as needed. Report of mild fatty liver.  He can follow-up with PCP in the future in a nonurgent visit.  Thank you. ----- Message ----- From: Interface, Rad Results In Sent: 05/17/2024   9:04 AM EDT To: Pauletta JAYSON Chihuahua, MD

## 2024-05-17 NOTE — Telephone Encounter (Signed)
 Notified of message below
